# Patient Record
Sex: Male | Born: 1960 | Race: White | Hispanic: No | Marital: Married | Smoking: Never smoker
Health system: Southern US, Community
[De-identification: ages and names within clinical notes are randomized; demographics above are authoritative.]

## PROBLEM LIST (undated history)

## (undated) DIAGNOSIS — J45909 Unspecified asthma, uncomplicated: Secondary | ICD-10-CM

## (undated) DIAGNOSIS — N400 Enlarged prostate without lower urinary tract symptoms: Secondary | ICD-10-CM

## (undated) DIAGNOSIS — T7840XA Allergy, unspecified, initial encounter: Secondary | ICD-10-CM

## (undated) DIAGNOSIS — M069 Rheumatoid arthritis, unspecified: Secondary | ICD-10-CM

## (undated) HISTORY — DX: Unspecified asthma, uncomplicated: J45.909

## (undated) HISTORY — PX: NO PAST SURGERIES: SHX2092

## (undated) HISTORY — DX: Rheumatoid arthritis, unspecified: M06.9

## (undated) HISTORY — DX: Allergy, unspecified, initial encounter: T78.40XA

## (undated) HISTORY — DX: Benign prostatic hyperplasia without lower urinary tract symptoms: N40.0

---

## 2003-12-08 ENCOUNTER — Encounter: Admission: RE | Admit: 2003-12-08 | Discharge: 2003-12-08 | Payer: Self-pay | Admitting: Family Medicine

## 2003-12-12 ENCOUNTER — Encounter: Admission: RE | Admit: 2003-12-12 | Discharge: 2003-12-12 | Payer: Self-pay | Admitting: Family Medicine

## 2007-01-03 ENCOUNTER — Ambulatory Visit: Payer: Self-pay | Admitting: Family Medicine

## 2007-01-03 LAB — CONVERTED CEMR LAB
ALT: 23 units/L (ref 0–53)
Albumin: 3.9 g/dL (ref 3.5–5.2)
Basophils Absolute: 0.1 10*3/uL (ref 0.0–0.1)
Bilirubin Urine: NEGATIVE
Bilirubin, Direct: 0.2 mg/dL (ref 0.0–0.3)
Blood in Urine, dipstick: NEGATIVE
CO2: 29 meq/L (ref 19–32)
Calcium: 9.4 mg/dL (ref 8.4–10.5)
Eosinophils Absolute: 0.4 10*3/uL (ref 0.0–0.6)
Eosinophils Relative: 6 % — ABNORMAL HIGH (ref 0.0–5.0)
GFR calc non Af Amer: 86 mL/min
Hemoglobin: 16.7 g/dL (ref 13.0–17.0)
LDL Cholesterol: 77 mg/dL (ref 0–99)
Lymphocytes Relative: 26.3 % (ref 12.0–46.0)
MCHC: 34.8 g/dL (ref 30.0–36.0)
Monocytes Absolute: 0.7 10*3/uL (ref 0.2–0.7)
Monocytes Relative: 9.6 % (ref 3.0–11.0)
Neutrophils Relative %: 57.4 % (ref 43.0–77.0)
Platelets: 204 10*3/uL (ref 150–400)
Potassium: 4 meq/L (ref 3.5–5.1)
RBC: 5.48 M/uL (ref 4.22–5.81)
Sodium: 141 meq/L (ref 135–145)
Total Bilirubin: 1 mg/dL (ref 0.3–1.2)
Total CHOL/HDL Ratio: 3.7
VLDL: 23 mg/dL (ref 0–40)

## 2007-01-18 ENCOUNTER — Ambulatory Visit: Payer: Self-pay | Admitting: Family Medicine

## 2007-02-22 ENCOUNTER — Ambulatory Visit: Payer: Self-pay | Admitting: Family Medicine

## 2008-11-19 ENCOUNTER — Ambulatory Visit: Payer: Self-pay | Admitting: Family Medicine

## 2008-11-19 DIAGNOSIS — J309 Allergic rhinitis, unspecified: Secondary | ICD-10-CM

## 2009-05-20 ENCOUNTER — Ambulatory Visit: Payer: Self-pay | Admitting: Family Medicine

## 2009-05-20 LAB — CONVERTED CEMR LAB
ALT: 23 units/L (ref 0–53)
AST: 22 units/L (ref 0–37)
BUN: 12 mg/dL (ref 6–23)
Bilirubin Urine: NEGATIVE
Blood in Urine, dipstick: NEGATIVE
CO2: 30 meq/L (ref 19–32)
Chloride: 106 meq/L (ref 96–112)
Cholesterol: 130 mg/dL (ref 0–200)
Creatinine, Ser: 1.1 mg/dL (ref 0.4–1.5)
Eosinophils Absolute: 0.5 10*3/uL (ref 0.0–0.7)
GFR calc non Af Amer: 76.21 mL/min (ref >60)
Glucose, Bld: 91 mg/dL (ref 70–99)
HCT: 47.8 % (ref 39.0–52.0)
Hemoglobin: 16.5 g/dL (ref 13.0–17.0)
Ketones, urine, test strip: NEGATIVE
LDL Cholesterol: 52 mg/dL (ref 0–99)
MCV: 90.4 fL (ref 78.0–100.0)
Monocytes Absolute: 0.6 10*3/uL (ref 0.1–1.0)
Neutro Abs: 3.7 10*3/uL (ref 1.4–7.7)
Platelets: 181 10*3/uL (ref 150.0–400.0)
Potassium: 4.6 meq/L (ref 3.5–5.1)
RBC: 5.29 M/uL (ref 4.22–5.81)
Total Bilirubin: 0.9 mg/dL (ref 0.3–1.2)
Total CHOL/HDL Ratio: 3
Total Protein: 7.7 g/dL (ref 6.0–8.3)
Urobilinogen, UA: 0.2
VLDL: 25.8 mg/dL (ref 0.0–40.0)
WBC Urine, dipstick: NEGATIVE
pH: 7

## 2009-06-25 ENCOUNTER — Ambulatory Visit: Payer: Self-pay | Admitting: Family Medicine

## 2010-04-19 NOTE — Assessment & Plan Note (Signed)
Summary: cpx//ccm----PTS WIFE [facility] // RS   Vital Signs:  Patient profile:   50 year old male Height:      73 inches Weight:      213 pounds Temp:     98.2 degrees F oral BP sitting:   120 / 84  (left arm) Cuff size:   regular  Vitals Entered By: Kern Reap CMA Duncan Dull) (June 25, 2009 2:13 PM) CC: cpx Is Patient Diabetic? No Pain Assessment Patient in pain? no        CC:  cpx.  History of Present Illness: Thomas Davidson  is a 50 year oldmarried male, nonsmoker, who is employed at [employer] who comes in today for his annual physical examination  Is always been in excellent health.  He said no that health habits he takes excellent care of himself.  He takes no medication on a regular basis.  He does get routine eye care and dental care, tetanus, 2004, and seasonal flu 2010.  He does have allergic rhinitis in the spring.  He says layer.  He's taking over-the-counter Claritin D..  Advised to take the regular.  No D.  Allergies: No Known Drug Allergies  Past History:  Past medical, surgical, family and social histories (including risk factors) reviewed, and no changes noted (except as noted below).  Past Medical History: Reviewed history from 01/18/2007 and no changes required. Unremarkable  Family History: Reviewed history from 01/18/2007 and no changes required. Family History of Alcoholism/Addiction glaucoma  Social History: Reviewed history from 01/18/2007 and no changes required. Occupation:[employer] Married Never Smoked Alcohol use-no Drug use-no Regular exercise-yes  Review of Systems      See HPI  Physical Exam  General:  Well-developed,well-nourished,in no acute distress; alert,appropriate and cooperative throughout examination Head:  Normocephalic and atraumatic without obvious abnormalities. No apparent alopecia or balding. Eyes:  No corneal or conjunctival inflammation noted. EOMI. Perrla. Funduscopic exam benign, without hemorrhages, exudates or  papilledema. Vision grossly normal. Ears:  External ear exam shows no significant lesions or deformities.  Otoscopic examination reveals clear canals, tympanic membranes are intact bilaterally without bulging, retraction, inflammation or discharge. Hearing is grossly normal bilaterally. Nose:  External nasal examination shows no deformity or inflammation. Nasal mucosa are pink and moist without lesions or exudates. Mouth:  Oral mucosa and oropharynx without lesions or exudates.  Teeth in good repair. Neck:  No deformities, masses, or tenderness noted. Chest Wall:  No deformities, masses, tenderness or gynecomastia noted. Breasts:  No masses or gynecomastia noted Lungs:  Normal respiratory effort, chest expands symmetrically. Lungs are clear to auscultation, no crackles or wheezes. Heart:  Normal rate and regular rhythm. S1 and S2 normal without gallop, murmur, click, rub or other extra sounds. Abdomen:  Bowel sounds positive,abdomen soft and non-tender without masses, organomegaly or hernias noted. Rectal:  No external abnormalities noted. Normal sphincter tone. No rectal masses or tenderness. Genitalia:  Testes bilaterally descended without nodularity, tenderness or masses. No scrotal masses or lesions. No penis lesions or urethral discharge. Prostate:  Prostate gland firm and smooth, no enlargement, nodularity, tenderness, mass, asymmetry or induration. Msk:  No deformity or scoliosis noted of thoracic or lumbar spine.   Pulses:  R and L carotid,radial,femoral,dorsalis pedis and posterior tibial pulses are full and equal bilaterally Extremities:  No clubbing, cyanosis, edema, or deformity noted with normal full range of motion of all joints.   Neurologic:  No cranial nerve deficits noted. Station and gait are normal. Plantar reflexes are down-going bilaterally. DTRs are  symmetrical throughout. Sensory, motor and coordinative functions appear intact. Skin:  Intact without suspicious lesions or  rashes Cervical Nodes:  No lymphadenopathy noted Axillary Nodes:  No palpable lymphadenopathy Inguinal Nodes:  No significant adenopathy Psych:  Cognition and judgment appear intact. Alert and cooperative with normal attention span and concentration. No apparent delusions, illusions, hallucinations   Impression & Recommendations:  Problem # 1:  ALLERGIC RHINITIS (ICD-477.9) Assessment Deteriorated  His updated medication list for this problem includes:    Flonase 50 Mcg/act Susp (Fluticasone propionate) ..... Uad  Orders: Prescription Created Electronically [id number]) EKG w/ Interpretation (93000)  Problem # 2:  PHYSICAL EXAMINATION (ICD-V70.0) Assessment: Unchanged  Orders: Prescription Created Electronically [id number]) EKG w/ Interpretation (93000)  Complete Medication List: 1)  Claritin-d 12 Hour 5-120 Mg Xr12h-tab (Loratadine-pseudoephedrine) .... Once daily 2)  Flonase 50 Mcg/act Susp (Fluticasone propionate) .... Uad  Patient Instructions: 1)  Please schedule a follow-up appointment in 1 year. 2)  Take an Aspirin every day. 3)  take regular Claritin, or regular Zyrtec.  Also, one shot of steroid nasal spray up each nostril at bedtime Prescriptions: FLONASE 50 MCG/ACT SUSP (FLUTICASONE PROPIONATE) UAD  #2 x 6   Entered and Authorized by:   Roderick Pee MD   Signed by:   Roderick Pee MD on 06/25/2009   Method used:   Electronically to        [facility]  [address] [phone]* (retail)       [address]       [place], [place]  [address]       Ph: [phone] or [phone]       Fax: [phone]   RxID:   [id number]    Immunization History:  Influenza Immunization History:    Influenza:  historical (12/18/2008)

## 2010-12-14 ENCOUNTER — Emergency Department (HOSPITAL_BASED_OUTPATIENT_CLINIC_OR_DEPARTMENT_OTHER)
Admission: EM | Admit: 2010-12-14 | Discharge: 2010-12-14 | Disposition: A | Discharge status: Home / Self Care | Payer: 59 | Attending: Emergency Medicine | Admitting: Emergency Medicine

## 2010-12-14 ENCOUNTER — Encounter: Payer: Self-pay | Admitting: Family Medicine

## 2010-12-14 DIAGNOSIS — M79609 Pain in unspecified limb: Secondary | ICD-10-CM | POA: Insufficient documentation

## 2010-12-14 DIAGNOSIS — M79603 Pain in arm, unspecified: Secondary | ICD-10-CM

## 2010-12-14 MED ORDER — CYCLOBENZAPRINE HCL 10 MG PO TABS: 10.0000 mg | ORAL_TABLET | Freq: Two times a day (BID) | ORAL | Status: AC | PRN

## 2010-12-14 MED ORDER — HYDROCODONE-ACETAMINOPHEN 5-325 MG PO TABS: 1.0000 | ORAL_TABLET | ORAL | Status: AC | PRN

## 2010-12-14 MED ORDER — IBUPROFEN 800 MG PO TABS: 800.0000 mg | ORAL_TABLET | Freq: Three times a day (TID) | ORAL | Status: AC

## 2010-12-14 NOTE — ED Notes (Signed)
Pt c/o left "bicep pain" since yesterday "lunchtime". Pt sts he gave blood "last Thursday" and concerned it could be associated. Pt sts pain does not radiate, denies cp, shob. Pt sts left arm pain worse with movement.

## 2010-12-14 NOTE — ED Provider Notes (Signed)
History     CSN: [id number] Arrival date & time: 12/14/2010  8:32 AM  Chief Complaint  Patient presents with  . Arm Pain    HPI  (Consider location/radiation/quality/duration/timing/severity/associated sxs/prior treatment)  HPI Comments: Patient presents today with left upper arm pain. It started yesterday. Patient notes no specific injury or strain. The pain is in his left biceps muscle and extends up to his left deltoid. It is worsened by complete flexion of the arm or lifting his arm up to shoulder height. Patient has no numbness, tingling, weakness. There is no erythema, swelling, warmth. No fevers. No neck pain. No prior similar pain in this arm. Patient did give blood in that arm the last Thursday but symptoms do not begin to yesterday. Patient has been trying Advil at home without significant relief. He did come in today because somebody told him to be concerned about a blood clot otherwise he would not have come in to the ER today.  Patient is a 50 y.o. male presenting with arm pain. The history is provided by the patient and the spouse. No language interpreter was used.  Arm Pain This is a new problem. The current episode started yesterday. The problem occurs daily. The problem has not changed since onset.Pertinent negatives include no chest pain, no abdominal pain, no headaches and no shortness of breath. The symptoms are relieved by nothing. The treatment provided moderate relief.    History reviewed. No pertinent past medical history.  History reviewed. No pertinent past surgical history.  No family history on file.  History  Substance Use Topics  . Smoking status: Never Smoker   . Smokeless tobacco: Not on file  . Alcohol Use: No      Review of Systems  Review of Systems  Constitutional: Negative.  Negative for fever and chills.  HENT: Negative.   Eyes: Negative.  Negative for discharge and redness.  Respiratory: Negative.  Negative for cough and shortness of  breath.   Cardiovascular: Negative.  Negative for chest pain.  Gastrointestinal: Negative.  Negative for nausea, vomiting and abdominal pain.  Genitourinary: Negative.  Negative for hematuria.  Musculoskeletal: Positive for myalgias. Negative for back pain.  Skin: Negative.  Negative for color change and rash.  Neurological: Negative for syncope and headaches.  Hematological: Negative.  Negative for adenopathy.  Psychiatric/Behavioral: Negative.  Negative for confusion.  All other systems reviewed and are negative.    Allergies  Review of patient's allergies indicates no known allergies.  Home Medications  No current outpatient prescriptions on file.  Physical Exam    BP 156/94  Pulse 99  Temp(Src) 97.9 F (36.6 C) (Oral)  Resp 16  Ht 6\' 1"  (1.854 m)  Wt 205 lb (92.987 kg)  BMI 27.05 kg/m2  SpO2 100%  Physical Exam  Constitutional: He is oriented to person, place, and time. He appears well-developed and well-nourished.  HENT:  Head: Normocephalic and atraumatic.  Eyes: Conjunctivae and EOM are normal. Pupils are equal, round, and reactive to light.  Neck: Normal range of motion.  Pulmonary/Chest: Effort normal.  Neurological: He is alert and oriented to person, place, and time.       Patient has no swelling to the left arm. No erythema or warmth. No rash. Palpable radial pulse is strong. Capillary refill less than 2 seconds. He has good grip strength. No numbness on exam. He is able to flex his arm but has pain upon doing so. He is able to raise his arm to shoulder height  but again has pain at reaching that height. No neck tenderness on exam.  Skin: Skin is warm and dry.  Psychiatric: He has a normal mood and affect. His behavior is normal. Judgment and thought content normal.    ED Course  Procedures (including critical care time)  Labs Reviewed - No data to display No results found.   No diagnosis found.   MDM Patient appears to have a musculoskeletal source  of his pain given that it is worsened by specific movements. He is neurovascularly intact on exam. His symptoms are not consistent with a DVT. They're also not consistent with ACS or PE. They're not specifically consistent with a cervical radiculopathy either. Patient will be given muscle relaxants, Vicodin, NSAIDs for home use. He's been advised to followup with his primary care physician if not improving or worsening over the next few days. Patient is amenable to this plan.        Nat Christen, MD 12/14/10 [phone]

## 2011-01-02 ENCOUNTER — Encounter: Payer: Self-pay | Admitting: Family Medicine

## 2011-01-02 ENCOUNTER — Ambulatory Visit (INDEPENDENT_AMBULATORY_CARE_PROVIDER_SITE_OTHER): Payer: 59 | Admitting: Family Medicine

## 2011-01-02 VITALS — BP 110/78 | Temp 98.5°F | Wt 207.0 lb

## 2011-01-02 DIAGNOSIS — M79609 Pain in unspecified limb: Secondary | ICD-10-CM

## 2011-01-02 DIAGNOSIS — M79622 Pain in left upper arm: Secondary | ICD-10-CM

## 2011-01-02 NOTE — Patient Instructions (Signed)
Hise the left arm for 20 minutes twice daily.  Motrin 600 mg 3 times daily with food.  If over the next couple weeks.  The soreness does not go away, that I would call Dr. Theron Arista Whitfield/or Dr. Cleophas Dunker at [facility]. MOC for consult

## 2011-01-02 NOTE — Progress Notes (Signed)
  Subjective:    Patient ID: Thomas Davidson, male    DOB: [birth year], 50 y.o.   MRN: [id number]  HPI Thomas Davidson is a 50 year old, married male, nonsmoker, who comes in today for evaluation of pain in his left biceps for about 3 weeks.  He states he donated blood on September the 20th and on September the 24th began having pain in his left biceps.  A friend told him he may have a blood clot.  Advised him to go to the emergency room.  In the emergency room and was given Vicodin and a muscle relaxant and told he had tendinitis.  Then, a couple weeks ago.  He felt popping sensation in his left biceps.  He is right-handed.  No history of trauma   Review of Systems    General an orthopedic review of systems otherwise negative Objective:   Physical Exam  Well-developed well-nourished, male in no acute distress.  Examination of the left shoulder was normal.  Elbow and hand are normal.  There is palpable tenderness in the biceps, and I can appreciate a marble-sized cystic lesion.  Its soft, rubbery, and movable      Assessment & Plan:  Pain at left biceps.  Plan elevation, ice, Motrin orthopedic consult if symptoms persist

## 2011-02-13 ENCOUNTER — Telehealth: Payer: Self-pay | Admitting: *Deleted

## 2011-02-13 MED ORDER — AZITHROMYCIN 500 MG PO TABS: 500.0000 mg | ORAL_TABLET | Freq: Every day | ORAL | Status: AC

## 2011-02-13 NOTE — Telephone Encounter (Signed)
Patient was exposed to pertussis.  Positive test. z pak per dr Kirtland Bouchard

## 2012-01-24 ENCOUNTER — Other Ambulatory Visit (INDEPENDENT_AMBULATORY_CARE_PROVIDER_SITE_OTHER): Payer: 59

## 2012-01-24 DIAGNOSIS — Z0000 Encounter for general adult medical examination without abnormal findings: Secondary | ICD-10-CM

## 2012-01-24 LAB — POCT URINALYSIS DIPSTICK
Glucose, UA: NEGATIVE
Leukocytes, UA: NEGATIVE
Urobilinogen, UA: 0.2

## 2012-01-24 LAB — CBC WITH DIFFERENTIAL/PLATELET
Basophils Absolute: 0.1 10*3/uL (ref 0.0–0.1)
Basophils Relative: 0.6 % (ref 0.0–3.0)
Eosinophils Absolute: 0.3 10*3/uL (ref 0.0–0.7)
HCT: 47.5 % (ref 39.0–52.0)
Hemoglobin: 15.9 g/dL (ref 13.0–17.0)
Lymphocytes Relative: 24.9 % (ref 12.0–46.0)
MCV: 90.3 fl (ref 78.0–100.0)
Monocytes Absolute: 0.9 10*3/uL (ref 0.1–1.0)
Monocytes Relative: 10.7 % (ref 3.0–12.0)
Neutrophils Relative %: 59.7 % (ref 43.0–77.0)
RDW: 12.8 % (ref 11.5–14.6)

## 2012-01-24 LAB — LIPID PANEL
Cholesterol: 130 mg/dL (ref 0–200)
LDL Cholesterol: 61 mg/dL (ref 0–99)
Total CHOL/HDL Ratio: 3
Triglycerides: 121 mg/dL (ref 0.0–149.0)
VLDL: 24.2 mg/dL (ref 0.0–40.0)

## 2012-01-24 LAB — HEPATIC FUNCTION PANEL
AST: 14 U/L (ref 0–37)
Alkaline Phosphatase: 63 U/L (ref 39–117)
Total Protein: 7.2 g/dL (ref 6.0–8.3)

## 2012-01-24 LAB — BASIC METABOLIC PANEL
Calcium: 9 mg/dL (ref 8.4–10.5)
Chloride: 105 mEq/L (ref 96–112)
Creatinine, Ser: 1.1 mg/dL (ref 0.4–1.5)
Sodium: 140 mEq/L (ref 135–145)

## 2012-01-24 LAB — TSH: TSH: 1.09 u[IU]/mL (ref 0.35–5.50)

## 2012-02-05 ENCOUNTER — Encounter: Payer: Self-pay | Admitting: Family Medicine

## 2012-02-05 ENCOUNTER — Ambulatory Visit (INDEPENDENT_AMBULATORY_CARE_PROVIDER_SITE_OTHER): Payer: 59 | Admitting: Family Medicine

## 2012-02-05 VITALS — BP 130/90 | Temp 98.7°F | Ht 72.75 in | Wt 216.0 lb

## 2012-02-05 DIAGNOSIS — Z23 Encounter for immunization: Secondary | ICD-10-CM

## 2012-02-05 DIAGNOSIS — Z0000 Encounter for general adult medical examination without abnormal findings: Secondary | ICD-10-CM

## 2012-02-05 NOTE — Progress Notes (Signed)
  Subjective:    Patient ID: Thomas Davidson, male    DOB: 1961-02-02, 51 y.o.   MRN: [id number]  HPI And Bynum is a 51 year old married male nonsmoker who comes in today for general physical examination  He's always been in excellent health he said no chronic health problems and takes no medication on a regular basis for any chronic diseases.  He states he feels well and has no complaints. He had a recent eye exam which was normal, regular dental care, colonoscopy with a 50 no family history of colon cancer or polyps. Tetanus 2004 seasonal flu shot today   Review of Systems  Constitutional: Negative.   HENT: Negative.   Eyes: Negative.   Respiratory: Negative.   Cardiovascular: Negative.   Gastrointestinal: Negative.   Genitourinary: Negative.   Musculoskeletal: Negative.   Skin: Negative.   Neurological: Negative.   Hematological: Negative.   Psychiatric/Behavioral: Negative.        Objective:   Physical Exam  Constitutional: He is oriented to person, place, and time. He appears well-developed and well-nourished.  HENT:  Head: Normocephalic and atraumatic.  Right Ear: External ear normal.  Left Ear: External ear normal.  Nose: Nose normal.  Mouth/Throat: Oropharynx is clear and moist.  Eyes: Conjunctivae normal and EOM are normal. Pupils are equal, round, and reactive to light.  Neck: Normal range of motion. Neck supple. No JVD present. No tracheal deviation present. No thyromegaly present.  Cardiovascular: Normal rate, regular rhythm, normal heart sounds and intact distal pulses.  Exam reveals no gallop and no friction rub.   No murmur heard. Pulmonary/Chest: Effort normal and breath sounds normal. No stridor. No respiratory distress. He has no wheezes. He has no rales. He exhibits no tenderness.  Abdominal: Soft. Bowel sounds are normal. He exhibits no distension and no mass. There is no tenderness. There is no rebound and no guarding.  Genitourinary: Rectum normal, prostate  normal and penis normal. Guaiac negative stool. No penile tenderness.  Musculoskeletal: Normal range of motion. He exhibits no edema and no tenderness.  Lymphadenopathy:    He has no cervical adenopathy.  Neurological: He is alert and oriented to person, place, and time. He has normal reflexes. No cranial nerve deficit. He exhibits normal muscle tone.  Skin: Skin is warm and dry. No rash noted. No erythema. No pallor.  Psychiatric: He has a normal mood and affect. His behavior is normal. Judgment and thought content normal.          Assessment & Plan:  Healthy male

## 2012-02-05 NOTE — Patient Instructions (Signed)
Continue your good health habits  Return in one year sooner if any problem 

## 2012-10-20 ENCOUNTER — Emergency Department (HOSPITAL_BASED_OUTPATIENT_CLINIC_OR_DEPARTMENT_OTHER)
Admission: EM | Admit: 2012-10-20 | Discharge: 2012-10-20 | Disposition: A | Discharge status: Home / Self Care | Payer: 59 | Attending: Emergency Medicine | Admitting: Emergency Medicine

## 2012-10-20 ENCOUNTER — Encounter (HOSPITAL_BASED_OUTPATIENT_CLINIC_OR_DEPARTMENT_OTHER): Payer: Self-pay

## 2012-10-20 DIAGNOSIS — R000 Tachycardia, unspecified: Secondary | ICD-10-CM | POA: Insufficient documentation

## 2012-10-20 DIAGNOSIS — M545 Low back pain, unspecified: Secondary | ICD-10-CM | POA: Insufficient documentation

## 2012-10-20 DIAGNOSIS — R52 Pain, unspecified: Secondary | ICD-10-CM | POA: Insufficient documentation

## 2012-10-20 DIAGNOSIS — Z79899 Other long term (current) drug therapy: Secondary | ICD-10-CM | POA: Insufficient documentation

## 2012-10-20 MED ORDER — CYCLOBENZAPRINE HCL 10 MG PO TABS: 10.0000 mg | ORAL_TABLET | Freq: Three times a day (TID) | ORAL | Status: DC | PRN

## 2012-10-20 NOTE — ED Notes (Signed)
Pt c/o mainly back spasm started yesterday."just got over a summer cold".  HR Normal sinus tachycardia 124, denied chest pain, nausea, dizziness, headache. Pt alert, oriented, in no acute distress. Though coherent.

## 2012-10-20 NOTE — ED Notes (Signed)
HR now Sinus 95

## 2012-10-20 NOTE — ED Provider Notes (Signed)
CSN: [id number]     Arrival date & time 10/20/12  0944 History     First MD Initiated Contact with Patient 10/20/12 1120     Chief Complaint  Patient presents with  . Tachycardia  . Back Spasm    (Consider location/radiation/quality/duration/timing/severity/associated sxs/prior Treatment) HPI 52 year old male had a bad coughing spell for a few minutes week ago and since then has had some positional low back pain was mild for several days until the last couple days he has had some moderately severe bilateral low back spasms worse with position changes better with heating pad Tylenol and left of her Flexeril which is now run out of, he is no midline back pain or radiation down his legs no weakness or numbness no change in bowel or bladder function no fever no IV drug abuse no back surgeries no chest pain cough shortness of breath abdominal pain vomiting dysuria or other concerns. Prescription for some more Flexeril since it seems to work for him. History reviewed. No pertinent past medical history. History reviewed. No pertinent past surgical history. No family history on file. History  Substance Use Topics  . Smoking status: Never Smoker   . Smokeless tobacco: Not on file  . Alcohol Use: No    Review of Systems 10 Systems reviewed and are negative for acute change except as noted in the HPI. Allergies  Review of patient's allergies indicates no known allergies.  Home Medications   Current Outpatient Rx  Name  Route  Sig  Dispense  Refill  . cyclobenzaprine (FLEXERIL) 10 MG tablet   Oral   Take 1 tablet (10 mg total) by mouth 3 (three) times daily as needed for muscle spasms.   20 tablet   0    BP 127/85  Pulse 83  Temp(Src) 98.3 F (36.8 C) (Oral)  Resp 16  SpO2 96% Physical Exam  Nursing note and vitals reviewed. Constitutional:  Awake, alert, nontoxic appearance with baseline speech.  HENT:  Head: Atraumatic.  Eyes: Pupils are equal, round, and reactive to light.  Right eye exhibits no discharge. Left eye exhibits no discharge.  Neck: Neck supple.  Cardiovascular: Normal rate and regular rhythm.   No murmur heard. Pulmonary/Chest: Effort normal and breath sounds normal. No respiratory distress. He has no wheezes. He has no rales. He exhibits no tenderness.  Abdominal: Soft. Bowel sounds are normal. He exhibits no mass. There is no tenderness. There is no rebound.  Musculoskeletal: He exhibits tenderness. He exhibits no edema.       Thoracic back: He exhibits no tenderness.       Lumbar back: He exhibits no tenderness.  Bilateral lower extremities non tender without new rashes or color change, baseline ROM with intact DP pulses, CR<2 secs all digits bilaterally, sensation baseline light touch bilaterally for pt, DTR's symmetric and intact bilaterally KJ / AJ, motor symmetric bilateral 5 / 5 hip flexion, quadriceps, hamstrings, EHL, foot dorsiflexion, foot plantarflexion, gait somewhat antalgic but without apparent new ataxia. No midline back tenderness and currently paralumbar region is nontender as well the patient's back spasms have resolved at current time.  Neurological:  Mental status baseline for patient.  Upper extremity motor strength and sensation intact and symmetric bilaterally.  Skin: No rash noted.  Psychiatric: He has a normal mood and affect.    ED Course  ECG: Sinus tachycardia, rate 114, normal axis, nonspecific T wave abnormality, no comparison ECG immediately available Patient / Family / Caregiver informed of clinical course, understand  medical decision-making process, and agree with plan. Procedures (including critical care time)  Labs Reviewed - No data to display No results found. 1. Acute low back pain     MDM  I doubt any other EMC precluding discharge at this time including, but not necessarily limited to the following:SBI, cauda equina.  Hurman Horn, MD 10/20/12 [phone]

## 2013-01-28 ENCOUNTER — Other Ambulatory Visit (INDEPENDENT_AMBULATORY_CARE_PROVIDER_SITE_OTHER): Payer: 59

## 2013-01-28 DIAGNOSIS — Z0000 Encounter for general adult medical examination without abnormal findings: Secondary | ICD-10-CM

## 2013-01-28 LAB — HEPATIC FUNCTION PANEL
ALT: 18 U/L (ref 0–53)
Albumin: 4 g/dL (ref 3.5–5.2)
Bilirubin, Direct: 0.1 mg/dL (ref 0.0–0.3)
Total Bilirubin: 0.9 mg/dL (ref 0.3–1.2)
Total Protein: 7.2 g/dL (ref 6.0–8.3)

## 2013-01-28 LAB — LIPID PANEL
Cholesterol: 120 mg/dL (ref 0–200)
HDL: 40.2 mg/dL (ref >39.00)
LDL Cholesterol: 65 mg/dL (ref 0–99)
Triglycerides: 73 mg/dL (ref 0.0–149.0)

## 2013-01-28 LAB — CBC WITH DIFFERENTIAL/PLATELET
HCT: 45.9 % (ref 39.0–52.0)
Lymphocytes Relative: 25.8 % (ref 12.0–46.0)
Lymphs Abs: 1.9 10*3/uL (ref 0.7–4.0)
MCHC: 34.6 g/dL (ref 30.0–36.0)
MCV: 87.2 fl (ref 78.0–100.0)
Monocytes Absolute: 0.8 10*3/uL (ref 0.1–1.0)
Monocytes Relative: 10.3 % (ref 3.0–12.0)
Platelets: 190 10*3/uL (ref 150.0–400.0)
RBC: 5.26 Mil/uL (ref 4.22–5.81)
WBC: 7.3 10*3/uL (ref 4.5–10.5)

## 2013-01-28 LAB — POCT URINALYSIS DIPSTICK
Blood, UA: NEGATIVE
Glucose, UA: NEGATIVE
Nitrite, UA: NEGATIVE
Protein, UA: NEGATIVE
Spec Grav, UA: 1.01
Urobilinogen, UA: 0.2

## 2013-01-28 LAB — BASIC METABOLIC PANEL
CO2: 27 mEq/L (ref 19–32)
Calcium: 9.2 mg/dL (ref 8.4–10.5)
GFR: 87.82 mL/min (ref >60.00)
Potassium: 3.9 mEq/L (ref 3.5–5.1)

## 2013-01-28 LAB — PSA: PSA: 0.58 ng/mL (ref 0.10–4.00)

## 2013-02-05 ENCOUNTER — Encounter: Payer: Self-pay | Admitting: Family Medicine

## 2013-02-05 ENCOUNTER — Ambulatory Visit (INDEPENDENT_AMBULATORY_CARE_PROVIDER_SITE_OTHER): Payer: 59 | Admitting: Family Medicine

## 2013-02-05 DIAGNOSIS — Z23 Encounter for immunization: Secondary | ICD-10-CM

## 2013-02-05 DIAGNOSIS — Z0000 Encounter for general adult medical examination without abnormal findings: Secondary | ICD-10-CM

## 2013-02-05 DIAGNOSIS — J309 Allergic rhinitis, unspecified: Secondary | ICD-10-CM

## 2013-02-05 NOTE — Progress Notes (Signed)
  Subjective:    Patient ID: Thomas Davidson, male    DOB: 1960-09-05, 52 y.o.   MRN: [id number]  HPI  Iris is a 52 year old married male nonsmoker who comes in today for a general physical exam  He's always been in excellent health he has no chronic health problems. He does not smoke nor drink. He's exercises on a regular basis he 73 inches tall 212-lead pounds. He does takes Zyrtec plain each bedtime for allergic rhinitis  He gets his eyes checked every 2 years for glaucoma, regular dental care, due for colonoscopy.  He was seen in the emergency room September for severe back pain. Somehow got a cardiogram which was normal  Review of Systems  Constitutional: Negative.   HENT: Negative.   Eyes: Negative.   Respiratory: Negative.   Cardiovascular: Negative.   Gastrointestinal: Negative.   Genitourinary: Negative.   Musculoskeletal: Negative.   Skin: Negative.   Neurological: Negative.   Psychiatric/Behavioral: Negative.        Objective:   Physical Exam  Nursing note and vitals reviewed. Constitutional: He is oriented to person, place, and time. He appears well-developed and well-nourished.  HENT:  Head: Normocephalic and atraumatic.  Right Ear: External ear normal.  Left Ear: External ear normal.  Nose: Nose normal.  Mouth/Throat: Oropharynx is clear and moist.  Eyes: Conjunctivae and EOM are normal. Pupils are equal, round, and reactive to light.  Neck: Normal range of motion. Neck supple. No JVD present. No tracheal deviation present. No thyromegaly present.  Cardiovascular: Normal rate, regular rhythm, normal heart sounds and intact distal pulses.  Exam reveals no gallop and no friction rub.   No murmur heard. No carotid bruit bruits peripheral pulses 2+ and symmetrical  Pulmonary/Chest: Effort normal and breath sounds normal. No stridor. No respiratory distress. He has no wheezes. He has no rales. He exhibits no tenderness.  Abdominal: Soft. Bowel sounds are normal. He  exhibits no distension and no mass. There is no tenderness. There is no rebound and no guarding.  Genitourinary: Rectum normal, prostate normal and penis normal. Guaiac negative stool. No penile tenderness.  Musculoskeletal: Normal range of motion. He exhibits no edema and no tenderness.  Lymphadenopathy:    He has no cervical adenopathy.  Neurological: He is alert and oriented to person, place, and time. He has normal reflexes. No cranial nerve deficit. He exhibits normal muscle tone.  Skin: Skin is warm and dry. No rash noted. No erythema. No pallor.   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 Advise sunscreens SPF 50+  Psychiatric: He has a normal mood and affect. His behavior is normal. Judgment and thought content normal.          Assessment & Plan:  Healthy male  Allergic rhinitis continue Zyrtec  Return in 2 years for general physical exam

## 2013-02-05 NOTE — Progress Notes (Signed)
Pre visit review using our clinic review tool, if applicable. No additional management support is needed unless otherwise documented below in the visit note. 

## 2013-02-05 NOTE — Patient Instructions (Signed)
Continue your good health habits  Remember to wear sunscreens SPF 50+ and reapply every 2 hours when you're out in the sun and wear a baseball cap to protect her nose  Return in 2 years for general physical examination  Return in one year for a thorough skin exam  Continue the Zyrtec plain for your allergy symptoms  Take an aspirin tablet daily  Call Dr. Ewing Schlein,,,,,,,,,,, at [phone] to get set up for a screening colonoscopy

## 2013-06-06 ENCOUNTER — Encounter: Payer: Self-pay | Admitting: Family Medicine

## 2013-06-06 ENCOUNTER — Ambulatory Visit (INDEPENDENT_AMBULATORY_CARE_PROVIDER_SITE_OTHER): Payer: 59 | Admitting: Family Medicine

## 2013-06-06 VITALS — BP 132/82 | HR 96 | Temp 98.3°F | Wt 214.0 lb

## 2013-06-06 DIAGNOSIS — J019 Acute sinusitis, unspecified: Secondary | ICD-10-CM

## 2013-06-06 DIAGNOSIS — R39198 Other difficulties with micturition: Secondary | ICD-10-CM

## 2013-06-06 MED ORDER — TAMSULOSIN HCL 0.4 MG PO CAPS: 0.4000 mg | ORAL_CAPSULE | Freq: Every day | ORAL | Status: DC

## 2013-06-06 MED ORDER — AMOXICILLIN 875 MG PO TABS: 875.0000 mg | ORAL_TABLET | Freq: Two times a day (BID) | ORAL | Status: AC

## 2013-06-06 NOTE — Patient Instructions (Signed)

## 2013-06-06 NOTE — Progress Notes (Signed)
Pre visit review using our clinic review tool, if applicable. No additional management support is needed unless otherwise documented below in the visit note. 

## 2013-06-06 NOTE — Progress Notes (Signed)
   Subjective:    Patient ID: Thomas Davidson, male    DOB: [birth year], 53 y.o.   MRN: [id number]  HPI Seen for the following issues  Three-week history of facial pain maxillary and frontal bilaterally. Thick yellow mucus. No fever. Mild headaches off and on. Intermittent cough. Increase facial pressure especially with bending over. He has tried Delsym without much improvement cough. Does have history of spring allergies but usually not this early in the year. No sneezing.  Decreased urinary stream pressure for several months. Had physical back in the fall PSA 0.58. No burning with urination. No frequency. No nocturia. No history of urethral stricture. No urethral discharge.  No past medical history on file. No past surgical history on file.  reports that he has never smoked. He does not have any smokeless tobacco history on file. He reports that he does not drink alcohol or use illicit drugs. family history is not on file. No Known Allergies    Review of Systems  Constitutional: Negative for fever and chills.  HENT: Positive for congestion and sinus pressure. Negative for facial swelling and sore throat.   Respiratory: Positive for cough.   Genitourinary: Positive for dysuria and decreased urine volume. Negative for frequency and hematuria.       Objective:   Physical Exam  Constitutional: He appears well-developed and well-nourished.  HENT:  Right Ear: External ear normal.  Left Ear: External ear normal.  Mouth/Throat: Oropharynx is clear and moist.  Neck: Neck supple.  Cardiovascular: Normal rate.   Pulmonary/Chest: Effort normal and breath sounds normal. No respiratory distress. He has no wheezes. He has no rales.  Lymphadenopathy:    He has no cervical adenopathy.          Assessment & Plan:  #1 probable acute sinusitis. Given duration of symptoms start amoxicillin 875 mg twice daily for 10 days. Continue Delsym cough syrup for cough #2 decreased urinary stream. Recent  PSA normal. No infectious symptoms. Question BPH versus other such as urethral stricture. Trial of Flomax 0.4 mg one each bedtime. Followup with primary in 2 weeks if not seeing improvement. May need urology referral for further evaluation

## 2013-08-05 ENCOUNTER — Telehealth: Payer: Self-pay | Admitting: Family Medicine

## 2013-08-05 DIAGNOSIS — Z1211 Encounter for screening for malignant neoplasm of colon: Secondary | ICD-10-CM

## 2013-08-05 NOTE — Telephone Encounter (Signed)
Pt is needing referral for a GI, for a colonoscopy. Pt states Dr. Sherren Mocha informed him to get colonoscopy done.

## 2013-09-01 ENCOUNTER — Telehealth: Payer: Self-pay | Admitting: Family Medicine

## 2013-09-01 ENCOUNTER — Encounter: Payer: Self-pay | Admitting: Internal Medicine

## 2013-09-01 NOTE — Telephone Encounter (Signed)
Pt' spouse called to check the status of his GI referral.  I advised her the referral was sent to [facility] and they were suppose to call him directly.  I gave her the number to the GI office. She verbalized understanding and is going to call them.

## 2013-10-20 ENCOUNTER — Ambulatory Visit (AMBULATORY_SURGERY_CENTER): Payer: Self-pay | Admitting: *Deleted

## 2013-10-20 VITALS — Ht 73.0 in | Wt 209.0 lb

## 2013-10-20 DIAGNOSIS — Z1211 Encounter for screening for malignant neoplasm of colon: Secondary | ICD-10-CM

## 2013-10-20 MED ORDER — MOVIPREP 100 G PO SOLR: ORAL | Status: DC

## 2013-10-20 NOTE — Progress Notes (Signed)
Patient denies any allergies to eggs or soy. Patient has never had anesthesia. Patient denies any oxygen use at home and does not take any diet/weight loss medications. EMMI education assisgned to patient on colonoscopy, this was explained and instructions given to patient.

## 2013-10-30 ENCOUNTER — Encounter: Payer: Self-pay | Admitting: Internal Medicine

## 2013-11-03 ENCOUNTER — Ambulatory Visit (AMBULATORY_SURGERY_CENTER): Payer: 59 | Admitting: Internal Medicine

## 2013-11-03 ENCOUNTER — Encounter: Payer: Self-pay | Admitting: Internal Medicine

## 2013-11-03 VITALS — BP 120/79 | HR 70 | Temp 98.0°F | Resp 20 | Ht 73.0 in | Wt 209.0 lb

## 2013-11-03 DIAGNOSIS — Z1211 Encounter for screening for malignant neoplasm of colon: Secondary | ICD-10-CM

## 2013-11-03 MED ORDER — SODIUM CHLORIDE 0.9 % IV SOLN: 500.0000 mL | INTRAVENOUS | Status: DC

## 2013-11-03 NOTE — Op Note (Signed)
[facility]  [address]. [place], [address]   COLONOSCOPY PROCEDURE REPORT  PATIENT: Thomas Davidson, Thomas Davidson  MR#: [id number] BIRTHDATE: 12/22/1960 , 53  yrs. old GENDER: Male ENDOSCOPIST: Eustace Quail, MD REFERRED ML:[id number] Delora Fuel, M.D. PROCEDURE DATE:  11/03/2013 PROCEDURE:   Colonoscopy, screening First Screening Colonoscopy - Avg.  risk and is 50 yrs.  old or older Yes.  Prior Negative Screening - Now for repeat screening. N/A  History of Adenoma - Now for follow-up colonoscopy & has been > or = to 3 yrs.  N/A  Polyps Removed Today? No.  Recommend repeat exam, <10 yrs? No. ASA CLASS:   Class I INDICATIONS:average risk screening. MEDICATIONS: MAC sedation, administered by CRNA and propofol (Diprivan) 370mg  IV  DESCRIPTION OF PROCEDURE:   After the risks benefits and alternatives of the procedure were thoroughly explained, informed consent was obtained.  A digital rectal exam revealed no abnormalities of the rectum.   The [facility] [id number]  endoscope was introduced through the anus and advanced to the cecum, which was identified by both the appendix and ileocecal valve. No adverse events experienced.   The quality of the prep was excellent, using MoviPrep  The instrument was then slowly withdrawn as the colon was fully examined.    COLON FINDINGS: A normal appearing cecum, ileocecal valve, and appendiceal orifice were identified.  The ascending, hepatic flexure, transverse, splenic flexure, descending, sigmoid colon and rectum appeared unremarkable.  No polyps or cancers were seen. Retroflexed views revealed no abnormalities. The time to cecum=2 minutes 13 seconds.  Withdrawal time=10 minutes 14 seconds.  The scope was withdrawn and the procedure completed.  COMPLICATIONS: There were no complications.  ENDOSCOPIC IMPRESSION: 1. Normal colon  RECOMMENDATIONS: 1. Continue current colorectal screening recommendations for "routine risk" patients with a  repeat colonoscopy in 10 years.   eSigned:  Eustace Quail, MD 11/03/2013 11:46 AM   cc: Dorena Cookey, MD and The Patient

## 2013-11-03 NOTE — Patient Instructions (Signed)
YOU HAD AN ENDOSCOPIC PROCEDURE TODAY AT [facility]: Refer to the procedure report that was given to you for any specific questions about what was found during the examination.  If the procedure report does not answer your questions, please call your gastroenterologist to clarify.  If you requested that your care partner not be given the details of your procedure findings, then the procedure report has been included in a sealed envelope for you to review at your convenience later.  YOU SHOULD EXPECT: Some feelings of bloating in the abdomen. Passage of more gas than usual.  Walking can help get rid of the air that was put into your GI tract during the procedure and reduce the bloating. If you had a lower endoscopy (such as a colonoscopy or flexible sigmoidoscopy) you may notice spotting of blood in your stool or on the toilet paper. If you underwent a bowel prep for your procedure, then you may not have a normal bowel movement for a few days.  DIET: Your first meal following the procedure should be a light meal and then it is ok to progress to your normal diet.  A half-sandwich or bowl of soup is an example of a good first meal.  Heavy or fried foods are harder to digest and may make you feel nauseous or bloated.  Likewise meals heavy in dairy and vegetables can cause extra gas to form and this can also increase the bloating.  Drink plenty of fluids but you should avoid alcoholic beverages for 24 hours.  ACTIVITY: Your care partner should take you home directly after the procedure.  You should plan to take it easy, moving slowly for the rest of the day.  You can resume normal activity the day after the procedure however you should NOT DRIVE or use heavy machinery for 24 hours (because of the sedation medicines used during the test).    SYMPTOMS TO REPORT IMMEDIATELY: A gastroenterologist can be reached at any hour.  During normal business hours, 8:30 AM to 5:00 PM Monday through Friday,  call [phone].  After hours and on weekends, please call the GI answering service at [phone] who will take a message and have the physician on call contact you.   Following lower endoscopy (colonoscopy or flexible sigmoidoscopy):  Excessive amounts of blood in the stool  Significant tenderness or worsening of abdominal pains  Swelling of the abdomen that is new, acute  Fever of 100F or higher  FOLLOW UP:  Our staff will call the home number listed on your records the next business day following your procedure to check on you and address any questions or concerns that you may have at that time regarding the information given to you following your procedure. This is a courtesy call and so if there is no answer at the home number and we have not heard from you through the emergency physician on call, we will assume that you have returned to your regular daily activities without incident.  SIGNATURES/CONFIDENTIALITY: You and/or your care partner have signed paperwork which will be entered into your electronic medical record.  These signatures attest to the fact that that the information above on your After Visit Summary has been reviewed and is understood.  Full responsibility of the confidentiality of this discharge information lies with you and/or your care-partner.  Continue your normal medications  Next colonoscopy- 1o years

## 2013-11-03 NOTE — Progress Notes (Signed)
Report to PACU, RN, vss, BBS= Clear.  

## 2013-11-04 ENCOUNTER — Telehealth: Payer: Self-pay | Admitting: *Deleted

## 2013-11-04 NOTE — Telephone Encounter (Signed)
  Follow up Call-  Call back number 11/03/2013  Post procedure Call Back phone  # [phone]  Permission to leave phone message Yes     Patient questions:  Do you have a fever, pain , or abdominal swelling? No. Pain Score  0 *  Have you tolerated food without any problems? Yes.    Have you been able to return to your normal activities? Yes.    Do you have any questions about your discharge instructions: Diet   No. Medications  No. Follow up visit  No.  Do you have questions or concerns about your Care? No.  Actions: * If pain score is 4 or above: No action needed, pain <4.

## 2014-01-26 ENCOUNTER — Ambulatory Visit (INDEPENDENT_AMBULATORY_CARE_PROVIDER_SITE_OTHER): Payer: 59 | Admitting: Family Medicine

## 2014-01-26 ENCOUNTER — Encounter: Payer: Self-pay | Admitting: Family Medicine

## 2014-01-26 VITALS — BP 110/80 | Temp 98.5°F | Wt 205.0 lb

## 2014-01-26 DIAGNOSIS — Z23 Encounter for immunization: Secondary | ICD-10-CM

## 2014-01-26 DIAGNOSIS — J301 Allergic rhinitis due to pollen: Secondary | ICD-10-CM

## 2014-01-26 MED ORDER — PREDNISONE 20 MG PO TABS: ORAL_TABLET | ORAL | Status: DC

## 2014-01-26 NOTE — Progress Notes (Signed)
   Subjective:    Patient ID: Thomas Davidson, male    DOB: Feb 09, 1961, 53 y.o.   MRN: [id number]  HPI Thomas Davidson is 53 year old male who comes in today for evaluation of allergic rhinitis.  He has perennial allergic rhinitis and takes Zyrtec 10 mg plain daily. 4 weeks ago he began having a lot of head congestion postnasal drip and cough and it hasn't gone away.   Review of Systems    no fever etc. Objective:   Physical Exam   well-developed well-nourished male no acute distress vital signs stable he is afebrile HEENT were negative except a lot of postnasal drip neck was supple no adenopathy lungs are clear no wheezing      Assessment & Plan:  Allergic rhinitis....... Prednisone burst and taper

## 2014-01-26 NOTE — Progress Notes (Signed)
Pre visit review using our clinic review tool, if applicable. No additional management support is needed unless otherwise documented below in the visit note. 

## 2014-01-26 NOTE — Patient Instructions (Signed)
Prednisone 20 mg........... Take 2 tablets now.......... Then one for 5 days, half for 5 days, then a half a tablet Monday Wednesday Friday for a 2 week taper  While your taken the prednisone hold the Zyrtec........... When she start tapering the prednisone,,,,,,, restart the Zyrtec

## 2014-01-27 ENCOUNTER — Telehealth: Payer: Self-pay | Admitting: Family Medicine

## 2014-01-27 NOTE — Telephone Encounter (Signed)
emmi emailed °

## 2014-05-14 ENCOUNTER — Ambulatory Visit: Payer: 59 | Admitting: Podiatry

## 2014-05-15 ENCOUNTER — Encounter: Payer: Self-pay | Admitting: Podiatry

## 2014-05-15 ENCOUNTER — Ambulatory Visit (INDEPENDENT_AMBULATORY_CARE_PROVIDER_SITE_OTHER): Payer: 59 | Admitting: Podiatry

## 2014-05-15 ENCOUNTER — Ambulatory Visit (INDEPENDENT_AMBULATORY_CARE_PROVIDER_SITE_OTHER): Payer: 59

## 2014-05-15 VITALS — BP 134/86 | HR 97 | Resp 13 | Ht 73.0 in | Wt 205.0 lb

## 2014-05-15 DIAGNOSIS — M79671 Pain in right foot: Secondary | ICD-10-CM

## 2014-05-15 DIAGNOSIS — M779 Enthesopathy, unspecified: Secondary | ICD-10-CM

## 2014-05-15 MED ORDER — MELOXICAM 7.5 MG PO TABS: 7.5000 mg | ORAL_TABLET | Freq: Every day | ORAL | Status: DC

## 2014-05-15 NOTE — Progress Notes (Signed)
   Subjective:    Patient ID: Thomas Davidson, male    DOB: 29-Aug-1960, 54 y.o.   MRN: [id number]  HPI 54 year old male presents the office they with complaints of right foot pain. States that he has pain particularly in the ball of his feet overlying the joints of the third, fourth, fifth digit. He states he has pain to the area has been progressive over the last 2 and half weeks. He denies a specific injury or trauma denies any change or increase in activity time of onset of symptoms. He denies any swelling or redness overlying the area. He states he has pain after standing for prolonged periods of time. He states pain starts to feel like a cramp-like sensation at times. No other complaints at this time.   Review of Systems  All other systems reviewed and are negative.      Objective:   Physical Exam AAO 3, NAD DP/PT pulses palpable, CRT less than 3 seconds Protective sensation intact with Simms Weinstein monofilament, vibratory sensation intact, Achilles tendon reflex intact There is mild tenderness to palpation upon palpation submetatarsal 4 on the right foot. There is no area pinpoint bony tenderness or pain with vibratory sensation overlying the metatarsal or digits. There is no pain with MTPJ range of motion. There is no overlying edema, erythema, increase in warmth. No other areas of tenderness to bilateral lower extremity is. No edema, erythema, increase in warmth. MMT 5/5, ROM WNL No open lesions or pre-ulcerative lesions bilaterally No pain with calf compression, swelling, warmth, erythema      Assessment & Plan:  54 year old male right foot submetatarsal 4 pain/capsulitis -X-rays were obtained and reviewed with the patient. There is no evidence of actual stress fracture at this time. The third metatarsal does appear to be long. Discussed likely etiology of the patient's symptoms. -Dispensed offloading pads to help take pressure off the metatarsal heads. Also prescribed meloxicam.  Discussed side effects the medication directed to stop immediately should any occur and call the office. -Follow-up in 3 weeks if symptoms are not resolved or sooner if any problems are to arise. In the meantime, encouraged to call the office with any questions/concerns/change in symptoms.  -If symptoms continue will re-xray next appointment.

## 2014-05-18 ENCOUNTER — Encounter: Payer: Self-pay | Admitting: Podiatry

## 2014-06-05 ENCOUNTER — Ambulatory Visit (INDEPENDENT_AMBULATORY_CARE_PROVIDER_SITE_OTHER): Payer: 59

## 2014-06-05 ENCOUNTER — Ambulatory Visit (INDEPENDENT_AMBULATORY_CARE_PROVIDER_SITE_OTHER): Payer: 59 | Admitting: Podiatry

## 2014-06-05 ENCOUNTER — Encounter: Payer: Self-pay | Admitting: Podiatry

## 2014-06-05 VITALS — BP 125/84 | HR 105 | Resp 15

## 2014-06-05 DIAGNOSIS — M79671 Pain in right foot: Secondary | ICD-10-CM | POA: Diagnosis not present

## 2014-06-05 DIAGNOSIS — M779 Enthesopathy, unspecified: Secondary | ICD-10-CM

## 2014-06-05 NOTE — Progress Notes (Signed)
Patient ID: Thomas Davidson, male   DOB: 01-14-1961, 54 y.o.   MRN: [id number]  Subjective: Thomas Davidson presents today for follow up evaluation of right foot pain. He states that since last appointment with wearing the metatarsal pads she does not have the pain to the bottom of his foot like he did before. He does state he continues has a mild discomfort to the foot however. Denies any swelling or redness around the area. He denies any recent injury or trauma. Denies any systemic complaints such as fevers, chills, nausea, vomiting. No acute changes since last appointment, and no other complaints at this time.   Objective: AAO x3, NAD DP/PT pulses palpable bilaterally, CRT less than 3 seconds Protective sensation intact with Simms Weinstein monofilament, vibratory sensation intact, Achilles tendon reflex intact There is mild tenderness to palpation overlying the fourth interspace on the right foot. There is no areas of pinpoint bony tenderness or pain with vibratory sensation of bilateral lower extremity. There is no pain with MTPJ range of motion. There is no palpable neuroma identified. There is no healing or numbness upon compression interspace. There is no tenderness to the metatarsals plantarly. No other areas of pinpoint bony tenderness or pain with vibratory sensation bilaterally. MMT 5/5, ROM WNL. No edema, erythema, increase in warmth to bilateral lower extremities bilaterally.  No open lesions or pre-ulcerative lesions.  No pain with calf compression, swelling, warmth, erythema  Assessment: Right foot fourth interspace pain  Plan: -All treatment options discussed with the patient including all alternatives, risks, complications.  -At this time discussed various treatment options including steroid injection. Patient wishes to proceed with steroid injection after discussing risks, complications and he verbally consents. Under sterile conditions a total of 1 mL mixture of dexamethasone phosphate and  0.5% Marcaine plain was infiltrated into the symptomatic areas of the right fourth interspace without complications. Patient tolerated the injection well without complications. Post injection care was discussed the patient. -Continue metatarsal offloading pads. -Discussed shoe gear modifications and orthotics. -Follow-up prior to his upcoming vacation or sooner if any problems are to arise. In the meantime encouraged to call the office with any questions, concerns, change in symptoms.

## 2014-06-08 ENCOUNTER — Encounter: Payer: Self-pay | Admitting: Podiatry

## 2014-06-24 ENCOUNTER — Encounter: Payer: Self-pay | Admitting: Podiatry

## 2014-06-24 ENCOUNTER — Ambulatory Visit (INDEPENDENT_AMBULATORY_CARE_PROVIDER_SITE_OTHER): Payer: 59 | Admitting: Podiatry

## 2014-06-24 VITALS — BP 117/65 | HR 87 | Resp 18

## 2014-06-24 DIAGNOSIS — M79671 Pain in right foot: Secondary | ICD-10-CM

## 2014-06-24 DIAGNOSIS — M779 Enthesopathy, unspecified: Secondary | ICD-10-CM

## 2014-06-24 MED ORDER — METHYLPREDNISOLONE (PAK) 4 MG PO TABS: ORAL_TABLET | ORAL | Status: DC

## 2014-06-24 NOTE — Progress Notes (Signed)
Patient ID: Thomas Davidson, male   DOB: 1961/03/13, 54 y.o.   MRN: [id number]  Subjective: Thomas Davidson presents today for follow up evaluation of right foot pain under the ball of the 4th toe. He states that since last appointment with wearing the metatarsal pads which provides some relief but continues to have pain. He states the injection did not help. He states he has the majority of pain to the area in the mornings when first standing or at the end of the day after being on it for a period of time. Denies any swelling or redness around the area. He denies any recent injury or trauma. Denies any systemic complaints such as fevers, chills, nausea, vomiting. No acute changes since last appointment, and no other complaints at this time.   Objective: AAO x3, NAD DP/PT pulses palpable bilaterally, CRT less than 3 seconds Protective sensation intact with Simms Weinstein monofilament, vibratory sensation intact, Achilles tendon reflex intact There is mild continued tenderness to palpation overlying the fourth interspace on the right foot and along the plantar aspect of the 4th metatarsal head. There is no areas of pinpoint bony tenderness or pain with vibratory sensation of bilateral lower extremity. There is no pain with MTPJ range of motion. There is no palpable neuroma identified. There is no surrounding nerve sensations upon compression of the fourth interspace or the other interspaces. There is prominence the metatarsal heads plantarly with mild atrophy of the fat pad. There is trace edema submetarsal 4 area. No  associated erythema or increase in warmth. No other areas of edema, erythema, increase in warmth bilaterally. No other areas of pinpoint bony tenderness or pain with vibratory sensation bilaterally. MMT 5/5, ROM WNL. No open lesions or pre-ulcerative lesions.  No pain with calf compression, swelling, warmth, erythema  Assessment: Right foot fourth interspace pain; plantar 4th metatarsal head pain.    Plan: -All treatment options discussed with the patient including all alternatives, risks, complications.  -As he continues to have symptoms to the area  prescribed Medrol Dosepak. Hold off on anti-inflammatory toe after finishing this.  -Continue metatarsal offloading pads I dispensed different pathways appointment cutouts for around the fourth metatarsal head. . -Discussed shoe gear modifications and orthotics. -If symptoms continue and appointment we'll likely obtain MRI or have custom orthotics made.  -Follow-uin 2 weeks or sooner if any problems are to arise. In the meantime encouraged to call the office with any questions, concerns, change in symptoms.

## 2014-07-08 ENCOUNTER — Ambulatory Visit: Payer: 59 | Admitting: Podiatry

## 2014-07-24 ENCOUNTER — Ambulatory Visit: Payer: 59 | Admitting: Podiatry

## 2014-11-17 ENCOUNTER — Ambulatory Visit (INDEPENDENT_AMBULATORY_CARE_PROVIDER_SITE_OTHER): Payer: 59 | Admitting: Adult Health

## 2014-11-17 ENCOUNTER — Encounter: Payer: Self-pay | Admitting: Adult Health

## 2014-11-17 VITALS — BP 124/80 | Temp 98.3°F | Ht 73.0 in | Wt 208.2 lb

## 2014-11-17 DIAGNOSIS — M545 Low back pain, unspecified: Secondary | ICD-10-CM

## 2014-11-17 MED ORDER — PREDNISONE 20 MG PO TABS: 20.0000 mg | ORAL_TABLET | Freq: Every day | ORAL | Status: DC

## 2014-11-17 MED ORDER — METHOCARBAMOL 750 MG PO TABS: 750.0000 mg | ORAL_TABLET | Freq: Three times a day (TID) | ORAL | Status: DC

## 2014-11-17 MED ORDER — OXYCODONE-ACETAMINOPHEN 10-325 MG PO TABS: 1.0000 | ORAL_TABLET | Freq: Three times a day (TID) | ORAL | Status: DC | PRN

## 2014-11-17 MED ORDER — KETOROLAC TROMETHAMINE 60 MG/2ML IM SOLN
60.0000 mg | Freq: Once | INTRAMUSCULAR | Status: AC
  Administered 2014-11-17: 60 mg via INTRAMUSCULAR

## 2014-11-17 NOTE — Progress Notes (Signed)
Pre visit review using our clinic review tool, if applicable. No additional management support is needed unless otherwise documented below in the visit note. 

## 2014-11-17 NOTE — Progress Notes (Addendum)
Subjective:    Patient ID: Thomas Davidson, male    DOB: 10-08-1960, 54 y.o.   MRN: [id number]  Back Pain This is a new problem. The current episode started in the past 7 days (Sunday). The problem occurs daily. The problem is unchanged. The pain is present in the lumbar spine and sacro-iliac. The quality of the pain is described as burning (knotting/spasming). The pain does not radiate. The pain is at a severity of 10/10. The pain is severe. The symptoms are aggravated by bending, sitting, position, standing and twisting. Stiffness is present all day. He has tried analgesics, bed rest and heat for the symptoms. The treatment provided no relief.   Denies any bowel or bladder incontinence. No numbness or tingling in extremities.    Review of Systems  Respiratory: Negative.   Cardiovascular: Negative.   Musculoskeletal: Positive for myalgias and back pain. Negative for joint swelling, gait problem, neck pain and neck stiffness.  Skin: Negative.   Neurological: Negative.   All other systems reviewed and are negative.  Past Medical History  Diagnosis Date  . Asthma     as child  . Allergy     Social History   Social History  . Marital Status: Married    Spouse Name: N/A  . Number of Children: N/A  . Years of Education: N/A   Occupational History  . Not on file.   Social History Main Topics  . Smoking status: Never Smoker   . Smokeless tobacco: Current User    Types: Chew  . Alcohol Use: No  . Drug Use: No  . Sexual Activity: Not on file   Other Topics Concern  . Not on file   Social History Narrative    Past Surgical History  Procedure Laterality Date  . No past surgeries      Family History  Problem Relation Age of Onset  . Colon cancer Neg Hx     Allergies  Allergen Reactions  . Bee Venom Anaphylaxis and Hives    Current Outpatient Prescriptions on File Prior to Visit  Medication Sig Dispense Refill  . cetirizine (ZYRTEC) 10 MG tablet Take 10 mg by mouth  daily.    . tamsulosin (FLOMAX) 0.4 MG CAPS capsule Take 1 capsule (0.4 mg total) by mouth daily. 30 capsule 3   No current facility-administered medications on file prior to visit.    BP 124/80 mmHg  Temp(Src) 98.3 F (36.8 C) (Oral)  Ht 6\' 1"  (1.854 m)  Wt 208 lb 3.2 oz (94.439 kg)  BMI 27.47 kg/m2       Objective:   Physical Exam  Constitutional: He is oriented to person, place, and time. He appears well-developed and well-nourished. No distress.  Eyes: Right eye exhibits no discharge. Left eye exhibits no discharge.  Cardiovascular: Normal rate, regular rhythm, normal heart sounds and intact distal pulses.  Exam reveals no gallop and no friction rub.   No murmur heard. Pulmonary/Chest: Effort normal and breath sounds normal. No respiratory distress. He has no wheezes. He has no rales. He exhibits no tenderness.  Musculoskeletal: He exhibits no edema or tenderness.  Unable to bend over a waist  Difficulty getting out of chair to standing position quickly.  Difficulty turning on side.   When laying supine he is able to do leg raises without any pain. No pain with knee to chest.   Neurological: He is alert and oriented to person, place, and time. He has normal reflexes.  Skin: Skin  is warm and dry. No rash noted. No erythema. No pallor.  Psychiatric: He has a normal mood and affect. His behavior is normal. Thought content normal.  Nursing note and vitals reviewed.     Assessment & Plan:  1. Bilateral low back pain without sciatica - Likely MSK in nature. Exam was not worry some for impingement of nerve or spinal stenosis.  - ketorolac (TORADOL) injection 60 mg; Inject 2 mLs (60 mg total) into the muscle once. - methocarbamol (ROBAXIN-750) 750 MG tablet; Take 1 tablet (750 mg total) by mouth 3 (three) times daily.  Dispense: 30 tablet; Refill: 0 - oxyCODONE-acetaminophen (PERCOCET) 10-325 MG per tablet; Take 1 tablet by mouth every 8 (eight) hours as needed for pain.   Dispense: 15 tablet; Refill: 0 - predniSONE (DELTASONE) 20 MG tablet; Take 1 tablet (20 mg total) by mouth daily with breakfast.  Dispense: 9 tablet; Refill: 0 - 40mg  x 3 days, 20 mg x 3 days.  - Follow up if no improvement in next 2-3 days.  - Will consider imaging of lumbar spine if no improvement.

## 2014-11-17 NOTE — Patient Instructions (Addendum)
It was great meeting you today! I am sorry you are in so much pain.   Your  Injury appears to be from a spasming muscle.    Take all medication as prescribed.   Prednisone:  Day 1 40 mg Day 2 40 mg Day 3 40 mg Day 4 20 mg Day 5 20 mg Day 6 20 mg  Robaxin (Muscle Relaxer), three times a day as needed  Percocet - every 8 hours as needed for break through pain  Continue to use heat and Ibuprofen every 8 hours.   Follow up if no improvement in the next 2-3 days   Lumbosacral Strain Lumbosacral strain is a strain of any of the parts that make up your lumbosacral vertebrae. Your lumbosacral vertebrae are the bones that make up the lower third of your backbone. Your lumbosacral vertebrae are held together by muscles and tough, fibrous tissue (ligaments).  CAUSES  A sudden blow to your back can cause lumbosacral strain. Also, anything that causes an excessive stretch of the muscles in the low back can cause this strain. This is typically seen when people exert themselves strenuously, fall, lift heavy objects, bend, or crouch repeatedly. RISK FACTORS  Physically demanding work.  Participation in pushing or pulling sports or sports that require a sudden twist of the back (tennis, golf, baseball).  Weight lifting.  Excessive lower back curvature.  Forward-tilted pelvis.  Weak back or abdominal muscles or both.  Tight hamstrings. SIGNS AND SYMPTOMS  Lumbosacral strain may cause pain in the area of your injury or pain that moves (radiates) down your leg.  DIAGNOSIS Your health care provider can often diagnose lumbosacral strain through a physical exam. In some cases, you may need tests such as X-ray exams.  TREATMENT  Treatment for your lower back injury depends on many factors that your clinician will have to evaluate. However, most treatment will include the use of anti-inflammatory medicines. HOME CARE INSTRUCTIONS   Avoid hard physical activities (tennis, racquetball,  waterskiing) if you are not in proper physical condition for it. This may aggravate or create problems.  If you have a back problem, avoid sports requiring sudden body movements. Swimming and walking are generally safer activities.  Maintain good posture.  Maintain a healthy weight.  For acute conditions, you may put ice on the injured area.  Put ice in a plastic bag.  Place a towel between your skin and the bag.  Leave the ice on for 20 minutes, 2-3 times a day.  When the low back starts healing, stretching and strengthening exercises may be recommended. SEEK MEDICAL CARE IF:  Your back pain is getting worse.  You experience severe back pain not relieved with medicines. SEEK IMMEDIATE MEDICAL CARE IF:   You have numbness, tingling, weakness, or problems with the use of your arms or legs.  There is a change in bowel or bladder control.  You have increasing pain in any area of the body, including your belly (abdomen).  You notice shortness of breath, dizziness, or feel faint.  You feel sick to your stomach (nauseous), are throwing up (vomiting), or become sweaty.  You notice discoloration of your toes or legs, or your feet get very cold. MAKE SURE YOU:   Understand these instructions.  Will watch your condition.  Will get help right away if you are not doing well or get worse. Document Released: 12/14/2004 Document Revised: 03/11/2013 Document Reviewed: 10/23/2012 [facility] Patient Information 2015 [place], [place]. This information is not intended to  replace advice given to you by your health care provider. Make sure you discuss any questions you have with your health care provider.  

## 2014-11-18 ENCOUNTER — Telehealth: Payer: Self-pay | Admitting: Adult Health

## 2014-11-18 ENCOUNTER — Telehealth: Payer: Self-pay | Admitting: Family Medicine

## 2014-11-18 NOTE — Telephone Encounter (Signed)
Spoke to patient on the phone. His back pain is getting better and so is the itching.He denies any rash or hives.  Advised he can try taking a benadryl with the Percocet to help with the itching. He is to follow up tomorrow if no improvement.

## 2014-11-18 NOTE — Telephone Encounter (Signed)
Pt saw Tommi Rumps on yesterday for back issues and was giving the following med  oxyCODONE-acetaminophen (PERCOCET) 10-325 MG per tablet pt said he began to itch after taking the med and is asking if this is a allergic reaction. Pt said he was not able to sleep for scratching.

## 2014-11-18 NOTE — Telephone Encounter (Signed)
Pls advise.  

## 2014-11-19 ENCOUNTER — Telehealth: Payer: Self-pay | Admitting: Family Medicine

## 2014-11-19 NOTE — Telephone Encounter (Signed)
Wife call to say pt is still having back pain and is having issues with urination.   Pt would like call back

## 2014-11-19 NOTE — Telephone Encounter (Signed)
Called and spoke with pt and pt states he is not having the spasms. Pt states the muscle spasms have gotten better and the pain is now dull. Pt;s concern is his urine stream.   Pt did not have a stream strong stream like before.  Pt states he has been drinking plenty of water and cranberry juice. Advised pt to continue to monitor his urine stream and call the office if pt begins to experience dysuria or hematuria.  Pt verbalized understanding.

## 2015-03-09 ENCOUNTER — Other Ambulatory Visit: Payer: Self-pay

## 2015-03-16 ENCOUNTER — Encounter: Payer: Self-pay | Admitting: Family Medicine

## 2015-03-23 ENCOUNTER — Other Ambulatory Visit (INDEPENDENT_AMBULATORY_CARE_PROVIDER_SITE_OTHER): Payer: 59

## 2015-03-23 DIAGNOSIS — Z0000 Encounter for general adult medical examination without abnormal findings: Secondary | ICD-10-CM | POA: Diagnosis not present

## 2015-03-23 LAB — PSA: PSA: 0.51 ng/mL (ref 0.10–4.00)

## 2015-03-23 LAB — POCT URINALYSIS DIPSTICK
Bilirubin, UA: NEGATIVE
Glucose, UA: NEGATIVE
Ketones, UA: NEGATIVE
Leukocytes, UA: NEGATIVE
NITRITE UA: NEGATIVE
PROTEIN UA: NEGATIVE
RBC UA: NEGATIVE
SPEC GRAV UA: 1.01
UROBILINOGEN UA: 0.2
pH, UA: 7

## 2015-03-23 LAB — TSH: TSH: 2.15 u[IU]/mL (ref 0.35–4.50)

## 2015-03-23 LAB — CBC WITH DIFFERENTIAL/PLATELET
BASOS ABS: 0 10*3/uL (ref 0.0–0.1)
Basophils Relative: 0.4 % (ref 0.0–3.0)
EOS PCT: 1.3 % (ref 0.0–5.0)
Eosinophils Absolute: 0.1 10*3/uL (ref 0.0–0.7)
HEMATOCRIT: 46.5 % (ref 39.0–52.0)
HEMOGLOBIN: 15.8 g/dL (ref 13.0–17.0)
LYMPHS PCT: 27.7 % (ref 12.0–46.0)
Lymphs Abs: 3 10*3/uL (ref 0.7–4.0)
MCHC: 33.9 g/dL (ref 30.0–36.0)
MCV: 89.2 fl (ref 78.0–100.0)
MONOS PCT: 7.6 % (ref 3.0–12.0)
Monocytes Absolute: 0.8 10*3/uL (ref 0.1–1.0)
Neutro Abs: 6.9 10*3/uL (ref 1.4–7.7)
Neutrophils Relative %: 63 % (ref 43.0–77.0)
Platelets: 202 10*3/uL (ref 150.0–400.0)
RBC: 5.21 Mil/uL (ref 4.22–5.81)
RDW: 12.8 % (ref 11.5–15.5)
WBC: 11 10*3/uL — AB (ref 4.0–10.5)

## 2015-03-23 LAB — HEPATIC FUNCTION PANEL
ALT: 18 U/L (ref 0–53)
AST: 12 U/L (ref 0–37)
Albumin: 3.9 g/dL (ref 3.5–5.2)
Alkaline Phosphatase: 63 U/L (ref 39–117)
Bilirubin, Direct: 0.1 mg/dL (ref 0.0–0.3)
Total Bilirubin: 0.7 mg/dL (ref 0.2–1.2)
Total Protein: 6.6 g/dL (ref 6.0–8.3)

## 2015-03-23 LAB — BASIC METABOLIC PANEL
BUN: 12 mg/dL (ref 6–23)
CO2: 29 mEq/L (ref 19–32)
CREATININE: 0.92 mg/dL (ref 0.40–1.50)
Calcium: 9.5 mg/dL (ref 8.4–10.5)
Chloride: 100 mEq/L (ref 96–112)
GFR: 91.47 mL/min (ref >60.00)
GLUCOSE: 81 mg/dL (ref 70–99)
POTASSIUM: 3.2 meq/L — AB (ref 3.5–5.1)
Sodium: 137 mEq/L (ref 135–145)

## 2015-03-23 LAB — LIPID PANEL
CHOLESTEROL: 162 mg/dL (ref 0–200)
HDL: 60.6 mg/dL (ref >39.00)
LDL Cholesterol: 76 mg/dL (ref 0–99)
NonHDL: 101.53
Total CHOL/HDL Ratio: 3
Triglycerides: 130 mg/dL (ref 0.0–149.0)
VLDL: 26 mg/dL (ref 0.0–40.0)

## 2015-03-29 ENCOUNTER — Encounter: Payer: Self-pay | Admitting: Family Medicine

## 2015-04-01 ENCOUNTER — Ambulatory Visit (INDEPENDENT_AMBULATORY_CARE_PROVIDER_SITE_OTHER): Payer: 59 | Admitting: Family Medicine

## 2015-04-01 ENCOUNTER — Encounter: Payer: Self-pay | Admitting: Family Medicine

## 2015-04-01 VITALS — BP 110/80 | Temp 98.2°F | Ht 73.5 in | Wt 209.0 lb

## 2015-04-01 DIAGNOSIS — Z0000 Encounter for general adult medical examination without abnormal findings: Secondary | ICD-10-CM | POA: Diagnosis not present

## 2015-04-01 DIAGNOSIS — J301 Allergic rhinitis due to pollen: Secondary | ICD-10-CM

## 2015-04-01 MED ORDER — TAMSULOSIN HCL 0.4 MG PO CAPS: 0.4000 mg | ORAL_CAPSULE | Freq: Every day | ORAL | Status: DC

## 2015-04-01 NOTE — Progress Notes (Signed)
   Subjective:    Patient ID: Thomas Davidson, male    DOB: Oct 25, 1960, 55 y.o.   MRN: [id number]  HPI Thomas Davidson is a 55 year old married male nonsmoker who comes in today for general physical examination  He's always been in excellent health he said no chronic health problems. He takes over-the-counter Zyrtec 10 mg plain for allergic rhinitis and Flomax 0.4 daily at bedtime when necessary for BPH  He gets routine eye care, dental care, colonoscopy 2015 normal.  Vaccinations up-to-date   Review of Systems  Constitutional: Negative.   HENT: Negative.   Eyes: Negative.   Respiratory: Negative.   Cardiovascular: Negative.   Gastrointestinal: Negative.   Endocrine: Negative.   Genitourinary: Negative.   Musculoskeletal: Negative.   Skin: Negative.   Allergic/Immunologic: Negative.   Neurological: Negative.   Hematological: Negative.   Psychiatric/Behavioral: Negative.        Objective:   Physical Exam  Constitutional: He is oriented to person, place, and time. He appears well-developed and well-nourished.  HENT:  Head: Normocephalic and atraumatic.  Right Ear: External ear normal.  Left Ear: External ear normal.  Nose: Nose normal.  Mouth/Throat: Oropharynx is clear and moist.  Eyes: Conjunctivae and EOM are normal. Pupils are equal, round, and reactive to light.  Neck: Normal range of motion. Neck supple. No JVD present. No tracheal deviation present. No thyromegaly present.  Cardiovascular: Normal rate, regular rhythm, normal heart sounds and intact distal pulses.  Exam reveals no gallop and no friction rub.   No murmur heard. Pulmonary/Chest: Effort normal and breath sounds normal. No stridor. No respiratory distress. He has no wheezes. He has no rales. He exhibits no tenderness.  Abdominal: Soft. Bowel sounds are normal. He exhibits no distension and no mass. There is no tenderness. There is no rebound and no guarding.  Genitourinary: Rectum normal, prostate normal and penis  normal. Guaiac negative stool. No penile tenderness.  Musculoskeletal: Normal range of motion. He exhibits no edema or tenderness.  Lymphadenopathy:    He has no cervical adenopathy.  Neurological: He is alert and oriented to person, place, and time. He has normal reflexes. No cranial nerve deficit. He exhibits normal muscle tone.  Skin: Skin is warm and dry. No rash noted. No erythema. No pallor.  Psychiatric: He has a normal mood and affect. His behavior is normal. Judgment and thought content normal.  Nursing note and vitals reviewed.         Assessment & Plan:  Healthy male  Allergic rhinitis continue Zyrtec  Symptoms of urinary obstruction with fairly normal urologic exam.......... Flomax Monday Wednesday Friday

## 2015-04-01 NOTE — Progress Notes (Signed)
Pre visit review using our clinic review tool, if applicable. No additional management support is needed unless otherwise documented below in the visit note. 

## 2015-04-01 NOTE — Patient Instructions (Signed)
Flomax.................. take 1 tablet Monday Wednesday Friday  Return in one year for general physical exam sooner if any problems.

## 2015-04-05 ENCOUNTER — Ambulatory Visit (INDEPENDENT_AMBULATORY_CARE_PROVIDER_SITE_OTHER): Payer: 59

## 2015-04-05 ENCOUNTER — Ambulatory Visit (INDEPENDENT_AMBULATORY_CARE_PROVIDER_SITE_OTHER): Payer: 59 | Admitting: Podiatry

## 2015-04-05 ENCOUNTER — Encounter: Payer: Self-pay | Admitting: Podiatry

## 2015-04-05 VITALS — BP 122/87 | HR 101 | Resp 18

## 2015-04-05 DIAGNOSIS — R52 Pain, unspecified: Secondary | ICD-10-CM

## 2015-04-05 DIAGNOSIS — D3610 Benign neoplasm of peripheral nerves and autonomic nervous system, unspecified: Secondary | ICD-10-CM | POA: Diagnosis not present

## 2015-04-05 DIAGNOSIS — G5761 Lesion of plantar nerve, right lower limb: Secondary | ICD-10-CM | POA: Diagnosis not present

## 2015-04-05 NOTE — Progress Notes (Signed)
Patient ID: JAYION FAVINGER, male   DOB: [birth year], 55 y.o.   MRN: [id number]  Subjective: 55 year old male presents the office today for recurrence of pain to his right foot. He states the pain is in between the third and fourth toes he feels that there is something in between the toes when he walks. He does get numbness and tingling to his third and fourth toes. He does of the metatarsal pads to help. No recent injury or trauma. No swelling or redness. Denies any systemic complaints such as fevers, chills, nausea, vomiting. No acute changes since last appointment, and no other complaints at this time.   Objective: AAO x3, NAD DP/PT pulses palpable bilaterally, CRT less than 3 seconds Protective sensation intact with Simms Weinstein monofilament; subjectively there is numbness and tingling to the right third and fourth toes.  There is tenderness to palpation on the interspace of the right foot in the third interspace. There is no clicking sensation or palpable neuroma identified at this time. There is no area pinpoint bony tenderness or pain the vibratory sensation bilaterally. MMT 5/5, ROM WNL. No edema, erythema, increase in warmth to bilateral lower extremities.  No open lesions or pre-ulcerative lesions.  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  -Etiology of symptoms were discussed -X-rays were obtained and reviewed with the patient. No acute fracture -A steroid injection was performed the right third interspace without complications around the area of maximal tenderness with a mixture of 0.5% Marcaine plain and Kenalog 10 without complications. Post injection care was discussed.  -Continue offloading pads. -Discussed ultrasound as well as alcohol sclerosing injections however we will hold off on  that for now.  -Patient encouraged to call the office with any questions, concerns, change in symptoms.  -FU 3 weeks  Celesta Gentile, DPM

## 2015-04-26 ENCOUNTER — Encounter: Payer: Self-pay | Admitting: Podiatry

## 2015-04-26 ENCOUNTER — Telehealth: Payer: Self-pay | Admitting: *Deleted

## 2015-04-26 ENCOUNTER — Ambulatory Visit (INDEPENDENT_AMBULATORY_CARE_PROVIDER_SITE_OTHER): Payer: 59 | Admitting: Podiatry

## 2015-04-26 VITALS — BP 161/107 | HR 86 | Resp 18

## 2015-04-26 DIAGNOSIS — D3610 Benign neoplasm of peripheral nerves and autonomic nervous system, unspecified: Secondary | ICD-10-CM

## 2015-04-26 DIAGNOSIS — G5761 Lesion of plantar nerve, right lower limb: Secondary | ICD-10-CM

## 2015-04-26 NOTE — Progress Notes (Signed)
Patient ID: Thomas Davidson, male   DOB: Jul 17, 1960, 55 y.o.   MRN: [id number]  Subjective: 55 year old male presents the office today for follow-up of pain to his right foot.  He states the injection did help for couple days in the past as help with the pain has recurred. He does continue to get numbness to his third and fourth toe on his right foot. Denies any systemic complaints such as fevers, chills, nausea, vomiting. No acute changes since last appointment, and no other complaints at this time.   Objective: AAO x3, NAD DP/PT pulses palpable bilaterally, CRT less than 3 seconds Protective sensation intact with Simms Weinstein monofilament; subjectively there is numbness and tingling to the right third and fourth toes which continues.  There is tenderness to palpation on the interspace of the right foot in the third interspace. There is no palpable neuroma identified at this time. There is no area pinpoint bony tenderness or pain the vibratory sensation bilaterally. MMT 5/5, ROM WNL. No edema, erythema, increase in warmth to bilateral lower extremities.  No open lesions or pre-ulcerative lesions.  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  -Etiology of symptoms were discussed -Discussed ultrasound, alcohol sclerosing injection and steroid injection. - at this time elects to proceed with a steroid injection. A steroid injection was performed the right third interspace without complications around the area of maximal tenderness with a mixture of 0.5% Marcaine plain and Kenalog 10 without complications. Post injection care was discussed.  -Will order an ultrasound to confirm neuroma  -Continue offloading pads. -Discussed ultrasound as well as alcohol sclerosing injections however we will hold off on that  for now.  -Patient encouraged to call the office with any questions, concerns, change in symptoms.  -F/U 3 weeks  Celesta Gentile, DPM

## 2015-04-26 NOTE — Telephone Encounter (Addendum)
-----   Message from Trula Slade, DPM sent at 04/26/2015  8:42 AM EST ----- Can you please order a ultrasound of the right foot to evaluate for neuroma of the 3rd interspace.   04/27/2015-[employer] DOES NOT REQUIRE PRIOR AUTHORIZATION FOR ULTRASOUNDS, REFERENCE #[id number]. Faxed to [place].

## 2015-05-03 ENCOUNTER — Ambulatory Visit
Admission: RE | Admit: 2015-05-03 | Discharge: 2015-05-03 | Disposition: A | Discharge status: Home / Self Care | Payer: 59 | Source: Ambulatory Visit | Attending: Podiatry | Admitting: Podiatry

## 2015-05-03 DIAGNOSIS — D3610 Benign neoplasm of peripheral nerves and autonomic nervous system, unspecified: Secondary | ICD-10-CM

## 2015-05-17 ENCOUNTER — Encounter: Payer: Self-pay | Admitting: Podiatry

## 2015-05-17 ENCOUNTER — Ambulatory Visit (INDEPENDENT_AMBULATORY_CARE_PROVIDER_SITE_OTHER): Payer: 59 | Admitting: Podiatry

## 2015-05-17 VITALS — BP 145/95 | HR 87 | Resp 18

## 2015-05-17 DIAGNOSIS — D3610 Benign neoplasm of peripheral nerves and autonomic nervous system, unspecified: Secondary | ICD-10-CM

## 2015-05-17 MED ORDER — METHYLPREDNISOLONE 4 MG PO TBPK: ORAL_TABLET | ORAL | Status: DC

## 2015-05-17 NOTE — Progress Notes (Signed)
Patient ID: SINCEAR TANTON, male   DOB: 1961-02-05, 55 y.o.   MRN: [id number]  Subjective: 55 year old male presents the office today for follow-up of pain to his right foot. He states the last steroid injection only lasted for a day or so before the pain started to recur. He states he's been getting more shooting pain into the third and fourth toes as well. Also presents to discuss ultrasound results. No recent injury or trauma. No swelling or redness. No other complaints at this time.  Objective: AAO x3, NAD DP/PT pulses palpable bilaterally, CRT less than 3 seconds Protective sensation intact with Simms Weinstein monofilament; subjectively there is numbness and tingling to the right third and fourth toes which continues.  There is tenderness to palpation on the interspace of the right foot in the third interspace. There is no area pinpoint bony tenderness or pain the vibratory sensation bilaterally. MMT 5/5, ROM WNL. No edema, erythema, increase in warmth to bilateral lower extremities.  No open lesions or pre-ulcerative lesions.  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  -Ultrasound results were discussed the patient. See below. -At this point a steroid injections are helping discussed all call sclerosing injections for which she elected to proceed with today. Under sterile conditions and all sclerosing injection was performed in the third interspace on the area of maximal tenderness without, occasions. Post injection care was discussed. -Offloading pads were dispensed. -Medrol Dosepak. He'll be loaded [place] this week has helped him last time he went as he is doing quite a bit of walking. -Follow-up in 2 weeks or sooner if any issues are to arise.  Celesta Gentile, DPM

## 2015-06-04 ENCOUNTER — Ambulatory Visit (INDEPENDENT_AMBULATORY_CARE_PROVIDER_SITE_OTHER): Payer: 59 | Admitting: Podiatry

## 2015-06-04 ENCOUNTER — Encounter: Payer: Self-pay | Admitting: Podiatry

## 2015-06-04 VITALS — BP 144/95 | HR 96 | Resp 18

## 2015-06-04 DIAGNOSIS — G5761 Lesion of plantar nerve, right lower limb: Secondary | ICD-10-CM | POA: Diagnosis not present

## 2015-06-04 DIAGNOSIS — D3610 Benign neoplasm of peripheral nerves and autonomic nervous system, unspecified: Secondary | ICD-10-CM

## 2015-06-04 DIAGNOSIS — M722 Plantar fascial fibromatosis: Secondary | ICD-10-CM

## 2015-06-04 NOTE — Progress Notes (Addendum)
Patient ID: Thomas Davidson, male   DOB: [birth year], 55 y.o.   MRN: [id number]  Subjective: 55 year old male presents the office today for follow-up of pain to his right foot. Since last appointment he did go to [employer] do quite a bit of walking. It is hiking shoes he does continue to get pain although he wears a tennis shoe with his offloading pads this does help. He has noticed a small amount of improvement since last when with the alcohol injections. He also states he gets pain on the arch of the foot when he has a lot of walking. Or redness. No recent injury or trauma. Continue to get numbness and tingling to his fourth and third toe. No other complaints at this time.  Objective: AAO x3, NAD DP/PT pulses palpable bilaterally, CRT less than 3 seconds Protective sensation intact with Simms Weinstein monofilament; subjectively there is numbness and tingling to the right third and fourth toes which continues.   There is tenderness to palpation on the interspace of the right foot in the third interspace. There is no area pinpoint bony tenderness or pain the vibratory sensation bilaterally. MMT 5/5, ROM WNL. No edema, erythema, increase in warmth to bilateral lower extremities.  There is mild tenderness on the medial band of the plantar fascia within the arch the foot. The plantar fascia appears to be intact. No pain on the insertion of the calcaneus. No open lesions or pre-ulcerative lesions.  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 . -Today he underwent a second alcohol sclerosing injection into the right third interspace without complications. Post injection care was discussed. Mrs. commode under sterile conditions. -Offloading pads were dispensed. -He was scanned for orthotics  today but we will await insurance approval but worsening them off. I believe this will help for the neuroma as well as the arch pain that he is having. -Follow-up in 2 weeks or sooner if any issues are to arise.  Celesta Gentile, DPM

## 2015-06-18 ENCOUNTER — Encounter: Payer: Self-pay | Admitting: Podiatry

## 2015-06-18 ENCOUNTER — Ambulatory Visit (INDEPENDENT_AMBULATORY_CARE_PROVIDER_SITE_OTHER): Payer: 59 | Admitting: Podiatry

## 2015-06-18 VITALS — BP 137/86 | HR 108 | Resp 18

## 2015-06-18 DIAGNOSIS — D3610 Benign neoplasm of peripheral nerves and autonomic nervous system, unspecified: Secondary | ICD-10-CM

## 2015-06-18 NOTE — Progress Notes (Signed)
Patient ID: Thomas Davidson, male   DOB: [birth year], 55 y.o.   MRN: [id number]  Subjective: 55 year old male presents the office today for follow-up of pain to his right foot. Since last appointment he states that he is doing well, and his pain has improved. He has not had the stinging pain that he was having before starting the injections. Continues with metatarsal pads which help as well. No swelling or redness. No recent injury or trauma. No other complaints.   Objective: AAO x3, NAD DP/PT pulses palpable bilaterally, CRT less than 3 seconds Protective sensation intact with Simms Weinstein monofilament; subjectively there is decreased numbness and tingling to the right third and fourth toes.    There is tenderness to palpation on the interspace of the right foot in the third interspace, but improved. There is no area pinpoint bony tenderness or pain the vibratory sensation bilaterally. MMT 5/5, ROM WNL. No edema, erythema, increase in warmth to bilateral lower extremities.  There is mild tenderness on the medial band of the plantar fascia within the arch the foot. The plantar fascia appears to be intact. No pain on the insertion of the calcaneus. No open lesions or pre-ulcerative lesions.  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 . -Today he underwent a third alcohol sclerosing injection into the right third interspace without complications. Post injection care was discussed. Mrs. commode under sterile conditions. -Offloading pads were dispensed. -Follow-up in 2 weeks or sooner if any issues are to arise.  Celesta Gentile, DPM

## 2015-07-09 ENCOUNTER — Ambulatory Visit: Payer: 59 | Admitting: Podiatry

## 2015-07-23 ENCOUNTER — Encounter: Payer: 59 | Admitting: Podiatry

## 2015-07-29 NOTE — Progress Notes (Signed)
Patient ID: Thomas Davidson, male   DOB: Jul 22, 1960, 55 y.o.   MRN: [id number]  No show

## 2016-04-28 ENCOUNTER — Other Ambulatory Visit: Payer: Self-pay | Admitting: Family Medicine

## 2016-05-18 ENCOUNTER — Ambulatory Visit (INDEPENDENT_AMBULATORY_CARE_PROVIDER_SITE_OTHER): Payer: 59 | Admitting: Family Medicine

## 2016-05-18 ENCOUNTER — Encounter: Payer: Self-pay | Admitting: Family Medicine

## 2016-05-18 VITALS — BP 120/86 | HR 90 | Temp 97.9°F | Ht 73.5 in | Wt 213.3 lb

## 2016-05-18 DIAGNOSIS — H6981 Other specified disorders of Eustachian tube, right ear: Secondary | ICD-10-CM | POA: Diagnosis not present

## 2016-05-18 DIAGNOSIS — J301 Allergic rhinitis due to pollen: Secondary | ICD-10-CM | POA: Diagnosis not present

## 2016-05-18 NOTE — Patient Instructions (Addendum)
BEFORE YOU LEAVE: -follow up: New Patient visit with Dr. Maudie Mercury in 2-4 weeks  Afrin nasal spray 2x daily for 3 days. Do not use longer then 3 days.  Flonase 2 sprays each nostril daily for 1 month.  Zyrtec nightly.  Follow up sooner if worsening, persistent ear pain, fevers, sinus pain or new concerns.  I hope you fell better soon!

## 2016-05-18 NOTE — Progress Notes (Signed)
Pre visit review using our clinic review tool, if applicable. No additional management support is needed unless otherwise documented below in the visit note. 

## 2016-05-18 NOTE — Progress Notes (Signed)
   HPI:  URI: -started: 1 week ago -symptoms:nasal congestion, PND, occasional pressure in right ear -denies:fever, SOB, NVD, tooth pain, sinus pain, body aches -has tried: Zyrtec -sick contacts/travel/risks: no reported flu, strep or tick exposure -Hx of: allergies  Wife sees me. He would like to transfer to me as well given Dr. Sherren Mocha is out frequently.  ROS: See pertinent positives and negatives per HPI.  Past Medical History:  Diagnosis Date  . Allergy   . Asthma    as child    Past Surgical History:  Procedure Laterality Date  . NO PAST SURGERIES      Family History  Problem Relation Age of Onset  . Colon cancer Neg Hx     Social History   Social History  . Marital status: Married    Spouse name: N/A  . Number of children: N/A  . Years of education: N/A   Social History Main Topics  . Smoking status: Never Smoker  . Smokeless tobacco: Current User    Types: Chew  . Alcohol use No  . Drug use: No  . Sexual activity: Not Asked   Other Topics Concern  . None   Social History Narrative  . None     Current Outpatient Prescriptions:  .  cetirizine (ZYRTEC) 10 MG tablet, Take 10 mg by mouth daily., Disp: , Rfl:  .  tamsulosin (FLOMAX) 0.4 MG CAPS capsule, TAKE 1 CAPSULE (0.4 MG TOTAL) BY MOUTH DAILY., Disp: 100 capsule, Rfl: 4  EXAM:  Vitals:   05/18/16 0937  BP: 120/86  Pulse: 90  Temp: 97.9 F (36.6 C)    Body mass index is 27.76 kg/m.  GENERAL: vitals reviewed and listed above, alert, oriented, appears well hydrated and in no acute distress  HEENT: atraumatic, conjunttiva clear, no obvious abnormalities on inspection of external nose and ears, normal appearance of ear canals and TMs Except for clear effusion on the right, clear nasal congestion, mild post oropharyngeal erythema with PND, no tonsillar edema or exudate, no sinus TTP  NECK: no obvious masses on inspection  LUNGS: clear to auscultation bilaterally, no wheezes, rales or  rhonchi, good air movement  CV: HRRR, no peripheral edema  MS: moves all extremities without noticeable abnormality  PSYCH: pleasant and cooperative, no obvious depression or anxiety  ASSESSMENT AND PLAN:  Discussed the following assessment and plan:  Acute seasonal allergic rhinitis due to pollen  Dysfunction of right eustachian tube  -given HPI and exam findings today, a serious infection or illness is unlikely. We discussed potential etiologies, with VURI and/or allergic rhinitis being most likely. Opted for short course of nasal decongestant, adding intranasal steroid to allergy regimen, continuing antihistamine. We discussed treatment side effects, likely course, antibiotic misuse, transmission, and signs of developing a serious illness. -of course, we advised to return or notify a doctor immediately if symptoms worsen or persist or new concerns arise. He refuses the flu vaccine.   Patient Instructions  BEFORE YOU LEAVE: -follow up: New Patient visit with Dr. Maudie Mercury in 2-4 weeks  Afrin nasal spray 2x daily for 3 days. Do not use longer then 3 days.  Flonase 2 sprays each nostril daily for 1 month.  Zyrtec nightly.  Follow up sooner if worsening, persistent ear pain, fevers, sinus pain or new concerns.  I hope you fell better soon!   Colin Benton R., DO

## 2016-06-11 NOTE — Progress Notes (Signed)
HPI:  Thomas Davidson is here to establish care. Used to see Dr. Sherren Mocha. He would like to do his preventive care labs/visit as well today. Reports only health problems are seasonal allergies and mid BPH. Take zyrtec seasonal. Takes flomax 3 days per week rxd by prior PCP and requests refill. Symptoms stbale. Due for hep c screen, flu vaccine, HIV screen, labs. Declines HIV screen. Reports had cholesterol and diabetes labs at work 1 month ago and normal. Gets 6000 steps per day. Feels diet is pretty good. Normal colonoscopy 10/2013. Normal PSA last year.  ROS negative for unless reported above: fevers, unintentional weight loss, hearing or vision loss, chest pain, palpitations, struggling to breath, hemoptysis, melena, hematochezia, hematuria, falls, loc, si, thoughts of self harm  Past Medical History:  Diagnosis Date  . Allergy   . Asthma    as child    Past Surgical History:  Procedure Laterality Date  . NO PAST SURGERIES      Family History  Problem Relation Age of Onset  . Throat cancer Father     smoker  . Colon cancer Neg Hx     Social History   Social History  . Marital status: Married    Spouse name: N/A  . Number of children: N/A  . Years of education: N/A   Social History Main Topics  . Smoking status: Never Smoker  . Smokeless tobacco: Current User    Types: Chew  . Alcohol use No  . Drug use: No  . Sexual activity: Not Asked   Other Topics Concern  . None   Social History Narrative   Work or School: [employer] Situation: lives with wife      Spiritual Beliefs: Baptist      Lifestyle: no aerobic exercise, diet so so        Current Outpatient Prescriptions:  .  cetirizine (ZYRTEC) 10 MG tablet, Take 10 mg by mouth daily., Disp: , Rfl:  .  tamsulosin (FLOMAX) 0.4 MG CAPS capsule, Take 1 capsule (0.4 mg total) by mouth daily., Disp: 100 capsule, Rfl: 4  EXAM:  Vitals:   06/12/16 0935  BP: 122/82  Pulse: 98  Temp: 98 F  (36.7 C)    Body mass index is 28.55 kg/m.  GENERAL: vitals reviewed and listed above, alert, oriented, appears well hydrated and in no acute distress  HEENT: atraumatic, conjunttiva clear, no obvious abnormalities on inspection of external nose and ears, normal appearance of ear canals and TMs, clear nasal congestion, mild post oropharyngeal erythema with PND, no tonsillar edema or exudate, no sinus TTP  NECK: no obvious masses on inspection  LUNGS: clear to auscultation bilaterally, no wheezes, rales or rhonchi, good air movement  CV: HRRR, no peripheral edema  GU: declined  SKIN: no abnormal lesions  MS: moves all extremities without noticeable abnormality  PSYCH: Thomas and cooperative, no obvious depression or anxiety  ASSESSMENT AND PLAN:  Discussed the following assessment and plan:  Encounter for preventive health examination  History of BPH - Plan: Basic metabolic panel  Acute seasonal allergic rhinitis due to pollen  Hep c screening in baby boomer - Plan: Hepatitis C antibody -We reviewed the PMH, PSH, FH, SH, Meds and Allergies. -We addressed current concerns per orders and patient instructions.. -level A and B USPSTF recs reviewed -health maintenance reviewed and updatesd -declined flu vaccine and HIV screen -labs per orders -refilled medication -advised increased exercise and healthy  diet -follow up yearly and as needed  -Patient advised to return or notify a doctor immediately if symptoms worsen or persist or new concerns arise.  Patient Instructions  BEFORE YOU LEAVE: -follow up: yearly for physical and as needed -labs  We have ordered labs or studies at this visit. It can take up to 1-2 weeks for results and processing. IF results require follow up or explanation, we will call you with instructions. Clinically stable results will be released to your [facility]. If you have not heard from [place] or cannot find your results in [facility] in 2 weeks please  contact our office at [phone].  If you are not yet signed up for [facility], please consider signing up.   We recommend the following healthy lifestyle for LIFE: 1) Small portions.   Tip: eat off of a salad plate instead of a dinner plate.  Tip: It is ok to feel hungry after a meal   Tip: if you need more or a snack choose fruits, veggies and/or a handful of nuts or seeds.  2) Eat a healthy clean diet.  * Tip: Avoid (less then 1 serving per week): processed foods, sweets, sweetened drinks, white starches (rice, flour, bread, potatoes, pasta, etc), red meat, fast foods, butter  *Tip: CHOOSE instead   * 5-9 servings per day of fresh or frozen fruits and vegetables (but not corn, potatoes, bananas, canned or dried fruit)   *nuts and seeds, beans   *olives and olive oil   *small portions of lean meats such as fish and white chicken    *small portions of whole grains  3)Get at least 150 minutes of sweaty aerobic exercise per week.  4)Reduce stress - consider counseling, meditation and relaxation to balance other aspects of your life.   WE NOW OFFER   [facility] FAST TRACK!!!  SAME DAY Appointments for ACUTE CARE  Such as: Sprains, Injuries, cuts, abrasions, rashes, muscle pain, joint pain, back pain Colds, flu, sore throats, headache, allergies, cough, fever  Ear pain, sinus and eye infections Abdominal pain, nausea, vomiting, diarrhea, upset stomach Animal/insect bites  3 Easy Ways to Schedule: Walk-In Scheduling Call in scheduling Mychart Sign-up: [email or web address]                  Colin Benton R.

## 2016-06-12 ENCOUNTER — Ambulatory Visit (INDEPENDENT_AMBULATORY_CARE_PROVIDER_SITE_OTHER): Payer: 59 | Admitting: Family Medicine

## 2016-06-12 ENCOUNTER — Encounter: Payer: Self-pay | Admitting: Family Medicine

## 2016-06-12 VITALS — BP 122/82 | HR 98 | Temp 98.0°F | Ht 71.5 in | Wt 207.6 lb

## 2016-06-12 DIAGNOSIS — J301 Allergic rhinitis due to pollen: Secondary | ICD-10-CM

## 2016-06-12 DIAGNOSIS — Z87438 Personal history of other diseases of male genital organs: Secondary | ICD-10-CM

## 2016-06-12 DIAGNOSIS — Z7289 Other problems related to lifestyle: Secondary | ICD-10-CM | POA: Diagnosis not present

## 2016-06-12 DIAGNOSIS — Z0000 Encounter for general adult medical examination without abnormal findings: Secondary | ICD-10-CM

## 2016-06-12 LAB — BASIC METABOLIC PANEL
BUN: 8 mg/dL (ref 6–23)
CALCIUM: 9.3 mg/dL (ref 8.4–10.5)
CO2: 26 meq/L (ref 19–32)
CREATININE: 0.89 mg/dL (ref 0.40–1.50)
Chloride: 104 mEq/L (ref 96–112)
GFR: 94.6 mL/min (ref >60.00)
GLUCOSE: 98 mg/dL (ref 70–99)
Potassium: 3.8 mEq/L (ref 3.5–5.1)
SODIUM: 137 meq/L (ref 135–145)

## 2016-06-12 MED ORDER — TAMSULOSIN HCL 0.4 MG PO CAPS: 0.4000 mg | ORAL_CAPSULE | Freq: Every day | ORAL | 4 refills | Status: DC

## 2016-06-12 NOTE — Patient Instructions (Signed)
BEFORE YOU LEAVE: -follow up: yearly for physical and as needed -labs  We have ordered labs or studies at this visit. It can take up to 1-2 weeks for results and processing. IF results require follow up or explanation, we will call you with instructions. Clinically stable results will be released to your [facility]. If you have not heard from [place] or cannot find your results in [facility] in 2 weeks please contact our office at [phone].  If you are not yet signed up for [facility], please consider signing up.   We recommend the following healthy lifestyle for LIFE: 1) Small portions.   Tip: eat off of a salad plate instead of a dinner plate.  Tip: It is ok to feel hungry after a meal   Tip: if you need more or a snack choose fruits, veggies and/or a handful of nuts or seeds.  2) Eat a healthy clean diet.  * Tip: Avoid (less then 1 serving per week): processed foods, sweets, sweetened drinks, white starches (rice, flour, bread, potatoes, pasta, etc), red meat, fast foods, butter  *Tip: CHOOSE instead   * 5-9 servings per day of fresh or frozen fruits and vegetables (but not corn, potatoes, bananas, canned or dried fruit)   *nuts and seeds, beans   *olives and olive oil   *small portions of lean meats such as fish and white chicken    *small portions of whole grains  3)Get at least 150 minutes of sweaty aerobic exercise per week.  4)Reduce stress - consider counseling, meditation and relaxation to balance other aspects of your life.   WE NOW OFFER   [facility] FAST TRACK!!!  SAME DAY Appointments for ACUTE CARE  Such as: Sprains, Injuries, cuts, abrasions, rashes, muscle pain, joint pain, back pain Colds, flu, sore throats, headache, allergies, cough, fever  Ear pain, sinus and eye infections Abdominal pain, nausea, vomiting, diarrhea, upset stomach Animal/insect bites  3 Easy Ways to Schedule: Walk-In Scheduling Call in scheduling Mychart Sign-up:  [email or web address]

## 2016-06-12 NOTE — Progress Notes (Signed)
Pre visit review using our clinic review tool, if applicable. No additional management support is needed unless otherwise documented below in the visit note. 

## 2016-06-13 LAB — HEPATITIS C ANTIBODY: HCV Ab: NEGATIVE

## 2016-10-25 ENCOUNTER — Ambulatory Visit (INDEPENDENT_AMBULATORY_CARE_PROVIDER_SITE_OTHER): Payer: 59 | Admitting: Family Medicine

## 2016-10-25 VITALS — BP 110/70 | HR 78 | Temp 98.2°F | Wt 210.3 lb

## 2016-10-25 DIAGNOSIS — J320 Chronic maxillary sinusitis: Secondary | ICD-10-CM | POA: Diagnosis not present

## 2016-10-25 NOTE — Progress Notes (Signed)
Subjective:     Patient ID: Thomas Davidson, male   DOB: 05-Sep-1960, 56 y.o.   MRN: [id number]  HPI Patient seen as a work in with 2 month history of right maxillary sinus fullness and congestion. He's tried multiple things including Flonase, Zyrtec, and Afrin without improvement. He's had some mild fatigue. No headaches. No fever. Occasional facial discomfort. He does have some allergy symptoms but right nasal congestion greater than left. Denies any history of chronic or recurrent sinusitis.  Past Medical History:  Diagnosis Date  . Allergy   . Asthma    as child   Past Surgical History:  Procedure Laterality Date  . NO PAST SURGERIES      reports that he has never smoked. His smokeless tobacco use includes Chew. He reports that he does not drink alcohol or use drugs. family history includes Throat cancer in his father. Allergies  Allergen Reactions  . Bee Venom Anaphylaxis and Hives     Review of Systems  Constitutional: Negative for chills and fever.  HENT: Positive for congestion and sinus pressure.   Respiratory: Negative for cough.   Neurological: Negative for headaches.       Objective:   Physical Exam  Constitutional: He appears well-developed and well-nourished.  HENT:  Right Ear: External ear normal.  Left Ear: External ear normal.  Mouth/Throat: Oropharynx is clear and moist.   significant turbulent swelling right naris greater than left but no visible polyps. Slightly tinged mucus on the right and clear on the left  Neck: Neck supple.  Cardiovascular: Normal rate and regular rhythm.   Pulmonary/Chest: Effort normal and breath sounds normal. No respiratory distress. He has no wheezes. He has no rales.  Lymphadenopathy:    He has no cervical adenopathy.       Assessment:     Patient presents with 2 month history of persistent right maxillary sinus congestion. Rule out acute versus chronic sinusitis    Plan:     -Continue with Flonase -Start Augmentin 875 mg  twice daily for 10 days -Prednisone taper over the next week -Consider limited CT sinuses if no better in 2 weeks  Eulas Post MD [facility]

## 2016-12-07 ENCOUNTER — Encounter: Payer: Self-pay | Admitting: Family Medicine

## 2017-03-29 ENCOUNTER — Encounter: Payer: Self-pay | Admitting: Family Medicine

## 2017-06-15 ENCOUNTER — Encounter: Payer: 59 | Admitting: Family Medicine

## 2017-06-20 LAB — LIPID PANEL
CHOLESTEROL: 110 (ref 0–200)
HDL: 15 — AB (ref 35–70)
Triglycerides: 105 (ref 40–160)

## 2017-06-20 LAB — BASIC METABOLIC PANEL: Glucose: 83

## 2017-06-27 NOTE — Progress Notes (Signed)
HPI:  Using dictation device. Unfortunately this device frequently misinterprets words/phrases.  Here for CPE:  -Concerns and/or follow up today: Rarely comes in - prior pt of Dr. Sherren Mocha. PMH seasonal allergies on zyrtec and BPH taking flomax 3 days per week from prior pcp.  Brought labs from health screen. See scanned document. -Diet: variety of foods, balance and well rounded, larger portion sizes - not many good fats -Exercise: > 10000 steps daily -Diabetes and Dyslipidemia Screening: -Hx of HTN: no -Vaccines: UTD -sexual activity: yes, male partner, no new partners -wants STI testing, Hep C screening (if born 01-1962): no -FH colon or prstate ca: see FH Last colon cancer screening: colonosocpy 10/2013  Last prostate ca screening:psa 2017 -Alcohol, Tobacco, drug use: see social history  Review of Systems - no fevers, unintentional weight loss, vision loss, hearing loss, chest pain, sob, hemoptysis, melena, hematochezia, hematuria, genital discharge, changing or concerning skin lesions, bleeding, bruising, loc, thoughts of self harm or SI  Past Medical History:  Diagnosis Date  . Allergy   . Asthma    as child    Past Surgical History:  Procedure Laterality Date  . NO PAST SURGERIES      Family History  Problem Relation Age of Onset  . Throat cancer Father        smoker  . Colon cancer Neg Hx     Social History   Socioeconomic History  . Marital status: Married    Spouse name: Not on file  . Number of children: Not on file  . Years of education: Not on file  . Highest education level: Not on file  Occupational History  . Not on file  Social Needs  . Financial resource strain: Not on file  . Food insecurity:    Worry: Not on file    Inability: Not on file  . Transportation needs:    Medical: Not on file    Non-medical: Not on file  Tobacco Use  . Smoking status: Never Smoker  . Smokeless tobacco: Current User    Types: Chew  Substance and Sexual  Activity  . Alcohol use: No  . Drug use: No  . Sexual activity: Not on file  Lifestyle  . Physical activity:    Days per week: Not on file    Minutes per session: Not on file  . Stress: Not on file  Relationships  . Social connections:    Talks on phone: Not on file    Gets together: Not on file    Attends religious service: Not on file    Active member of club or organization: Not on file    Attends meetings of clubs or organizations: Not on file    Relationship status: Not on file  Other Topics Concern  . Not on file  Social History Narrative   Work or School: [employer] Situation: lives with wife      Spiritual Beliefs: Baptist      Lifestyle: no aerobic exercise, diet so so     Current Outpatient Medications:  .  cetirizine (ZYRTEC) 10 MG tablet, Take 10 mg by mouth daily., Disp: , Rfl:  .  tamsulosin (FLOMAX) 0.4 MG CAPS capsule, Take 1 capsule (0.4 mg total) by mouth daily., Disp: 100 capsule, Rfl: 4  EXAM:  Vitals:   06/28/17 0926  BP: 110/78  Pulse: 86  Temp: 98 F (36.7 C)  TempSrc: Oral  Weight: 205 lb 3.2 oz (  93.1 kg)  Height: '6\' 1"'  (1.854 m)    Estimated body mass index is 27.07 kg/m as calculated from the following:   Height as of this encounter: '6\' 1"'  (1.854 m).   Weight as of this encounter: 205 lb 3.2 oz (93.1 kg).  GENERAL: vitals reviewed and listed below, alert, oriented, appears well hydrated and in no acute distress  HEENT: head atraumatic, PERRLA, normal appearance of eyes, ears, nose and mouth. moist mucus membranes.  NECK: supple, no masses or lymphadenopathy  LUNGS: clear to auscultation bilaterally, no rales, rhonchi or wheeze  CV: HRRR, no peripheral edema or cyanosis, normal pedal pulses  ABDOMEN: bowel sounds normal, soft, non tender to palpation, no masses, no rebound or guarding  GU: declined  RECTAL: refused  SKIN: few AKs on ear R, ? AK vs seb ker R dorasl hand, declined full skin exam  MS:  normal gait, moves all extremities normally  NEURO: normal gait, speech and thought processing grossly intact, muscle tone grossly intact throughout  PSYCH: normal affect, pleasant and cooperative  ASSESSMENT AND PLAN:  Discussed the following assessment and plan:  PREVENTIVE EXAM: -Discussed and advised all [place] preventive services health task force level A and B recommendations for age, sex and risks. -Advised at least 150 minutes of exercise per week and a healthy diet  -discuss prostate cancer screening, declined today -labs, studies and vaccines per orders this encounter -advised adding good fats to diet to improve hdl  Screening for depression -negative  AK (actinic keratosis) -advised treatment, he opted to return for LN2  Patient Instructions  BEFORE YOU LEAVE: -Wendie Simmer, copy of labs to scan and return to him -follow up: follow up in 2-4 weeks for AK   Preventive Care 40-64 Years, Male Preventive care refers to lifestyle choices and visits with your health care provider that can promote health and wellness. What does preventive care include?  A yearly physical exam. This is also called an annual well check.  Dental exams once or twice a year.  Routine eye exams. Ask your health care provider how often you should have your eyes checked.  Personal lifestyle choices, including: ? Daily care of your teeth and gums. ? Regular physical activity. ? Eating a healthy diet. ? Avoiding tobacco and drug use. ? Limiting alcohol use. ? Practicing safe sex. ? Taking low-dose aspirin every day starting at age 94. What happens during an annual well check? The services and screenings done by your health care provider during your annual well check will depend on your age, overall health, lifestyle risk factors, and family history of disease. Counseling Your health care provider may ask you questions about your:  Alcohol use.  Tobacco use.  Drug use.  Emotional  well-being.  Home and relationship well-being.  Sexual activity.  Eating habits.  Work and work Statistician.  Screening You may have the following tests or measurements:  Height, weight, and BMI.  Blood pressure.  Lipid and cholesterol levels. These may be checked every 5 years, or more frequently if you are over 48 years old.  Skin check.  Lung cancer screening. You may have this screening every year starting at age 62 if you have a 30-pack-year history of smoking and currently smoke or have quit within the past 15 years.  Fecal occult blood test (FOBT) of the stool. You may have this test every year starting at age 72.  Flexible sigmoidoscopy or colonoscopy. You may have a sigmoidoscopy every 5 years or a  colonoscopy every 10 years starting at age 62.  Prostate cancer screening. Recommendations will vary depending on your family history and other risks.  Hepatitis C blood test.  Hepatitis B blood test.  Sexually transmitted disease (STD) testing.  Diabetes screening. This is done by checking your blood sugar (glucose) after you have not eaten for a while (fasting). You may have this done every 1-3 years.  Discuss your test results, treatment options, and if necessary, the need for more tests with your health care provider. Vaccines Your health care provider may recommend certain vaccines, such as:  Influenza vaccine. This is recommended every year.  Tetanus, diphtheria, and acellular pertussis (Tdap, Td) vaccine. You may need a Td booster every 10 years.  Varicella vaccine. You may need this if you have not been vaccinated.  Zoster vaccine. You may need this after age 42.  Measles, mumps, and rubella (MMR) vaccine. You may need at least one dose of MMR if you were born in 1957 or later. You may also need a second dose.  Pneumococcal 13-valent conjugate (PCV13) vaccine. You may need this if you have certain conditions and have not been vaccinated.  Pneumococcal  polysaccharide (PPSV23) vaccine. You may need one or two doses if you smoke cigarettes or if you have certain conditions.  Meningococcal vaccine. You may need this if you have certain conditions.  Hepatitis A vaccine. You may need this if you have certain conditions or if you travel or work in places where you may be exposed to hepatitis A.  Hepatitis B vaccine. You may need this if you have certain conditions or if you travel or work in places where you may be exposed to hepatitis B.  Haemophilus influenzae type b (Hib) vaccine. You may need this if you have certain risk factors.  Talk to your health care provider about which screenings and vaccines you need and how often you need them. This information is not intended to replace advice given to you by your health care provider. Make sure you discuss any questions you have with your health care provider. Document Released: 04/02/2015 Document Revised: 11/24/2015 Document Reviewed: 01/05/2015 Elsevier Interactive Patient Education  2018 [employer].     No follow-ups on file.   Lucretia Kern, DO

## 2017-06-28 ENCOUNTER — Encounter: Payer: Self-pay | Admitting: Family Medicine

## 2017-06-28 ENCOUNTER — Ambulatory Visit (INDEPENDENT_AMBULATORY_CARE_PROVIDER_SITE_OTHER): Payer: 59 | Admitting: Family Medicine

## 2017-06-28 VITALS — BP 110/78 | HR 86 | Temp 98.0°F | Ht 73.0 in | Wt 205.2 lb

## 2017-06-28 DIAGNOSIS — L570 Actinic keratosis: Secondary | ICD-10-CM

## 2017-06-28 DIAGNOSIS — Z1331 Encounter for screening for depression: Secondary | ICD-10-CM | POA: Diagnosis not present

## 2017-06-28 DIAGNOSIS — Z0001 Encounter for general adult medical examination with abnormal findings: Secondary | ICD-10-CM

## 2017-06-28 DIAGNOSIS — Z0000 Encounter for general adult medical examination without abnormal findings: Secondary | ICD-10-CM

## 2017-06-28 NOTE — Patient Instructions (Signed)
BEFORE YOU LEAVE: -Wendie Simmer, copy of labs to scan and return to him -follow up: follow up in 2-4 weeks for AK   Preventive Care 40-64 Years, Male Preventive care refers to lifestyle choices and visits with your health care provider that can promote health and wellness. What does preventive care include?  A yearly physical exam. This is also called an annual well check.  Dental exams once or twice a year.  Routine eye exams. Ask your health care provider how often you should have your eyes checked.  Personal lifestyle choices, including: ? Daily care of your teeth and gums. ? Regular physical activity. ? Eating a healthy diet. ? Avoiding tobacco and drug use. ? Limiting alcohol use. ? Practicing safe sex. ? Taking low-dose aspirin every day starting at age 23. What happens during an annual well check? The services and screenings done by your health care provider during your annual well check will depend on your age, overall health, lifestyle risk factors, and family history of disease. Counseling Your health care provider may ask you questions about your:  Alcohol use.  Tobacco use.  Drug use.  Emotional well-being.  Home and relationship well-being.  Sexual activity.  Eating habits.  Work and work Statistician.  Screening You may have the following tests or measurements:  Height, weight, and BMI.  Blood pressure.  Lipid and cholesterol levels. These may be checked every 5 years, or more frequently if you are over 75 years old.  Skin check.  Lung cancer screening. You may have this screening every year starting at age 31 if you have a 30-pack-year history of smoking and currently smoke or have quit within the past 15 years.  Fecal occult blood test (FOBT) of the stool. You may have this test every year starting at age 60.  Flexible sigmoidoscopy or colonoscopy. You may have a sigmoidoscopy every 5 years or a colonoscopy every 10 years starting at age  67.  Prostate cancer screening. Recommendations will vary depending on your family history and other risks.  Hepatitis C blood test.  Hepatitis B blood test.  Sexually transmitted disease (STD) testing.  Diabetes screening. This is done by checking your blood sugar (glucose) after you have not eaten for a while (fasting). You may have this done every 1-3 years.  Discuss your test results, treatment options, and if necessary, the need for more tests with your health care provider. Vaccines Your health care provider may recommend certain vaccines, such as:  Influenza vaccine. This is recommended every year.  Tetanus, diphtheria, and acellular pertussis (Tdap, Td) vaccine. You may need a Td booster every 10 years.  Varicella vaccine. You may need this if you have not been vaccinated.  Zoster vaccine. You may need this after age 12.  Measles, mumps, and rubella (MMR) vaccine. You may need at least one dose of MMR if you were born in 1957 or later. You may also need a second dose.  Pneumococcal 13-valent conjugate (PCV13) vaccine. You may need this if you have certain conditions and have not been vaccinated.  Pneumococcal polysaccharide (PPSV23) vaccine. You may need one or two doses if you smoke cigarettes or if you have certain conditions.  Meningococcal vaccine. You may need this if you have certain conditions.  Hepatitis A vaccine. You may need this if you have certain conditions or if you travel or work in places where you may be exposed to hepatitis A.  Hepatitis B vaccine. You may need this if you have  certain conditions or if you travel or work in places where you may be exposed to hepatitis B.  Haemophilus influenzae type b (Hib) vaccine. You may need this if you have certain risk factors.  Talk to your health care provider about which screenings and vaccines you need and how often you need them. This information is not intended to replace advice given to you by your health  care provider. Make sure you discuss any questions you have with your health care provider. Document Released: 04/02/2015 Document Revised: 11/24/2015 Document Reviewed: 01/05/2015 Elsevier Interactive Patient Education  [employer].

## 2017-06-30 ENCOUNTER — Other Ambulatory Visit: Payer: Self-pay | Admitting: Family Medicine

## 2017-07-26 ENCOUNTER — Ambulatory Visit: Payer: 59 | Admitting: Family Medicine

## 2017-12-07 IMAGING — US US EXTREM LOW*R* LIMITED
1 series · 13 of 13 positions shown · non-contrast
Comparison: Radiographs dated 04/05/2015

CLINICAL DATA: Morton's neuroma.

EXAM:
ULTRASOUND RIGHT LOWER EXTREMITY LIMITED
TECHNIQUE: Ultrasound examination of the lower extremity soft tissues was
performed in the area of clinical concern.

[Series 1: us extrem low*right* limited · 0.05mm/px · 13 of 13 slices shown]
[im 1/13]
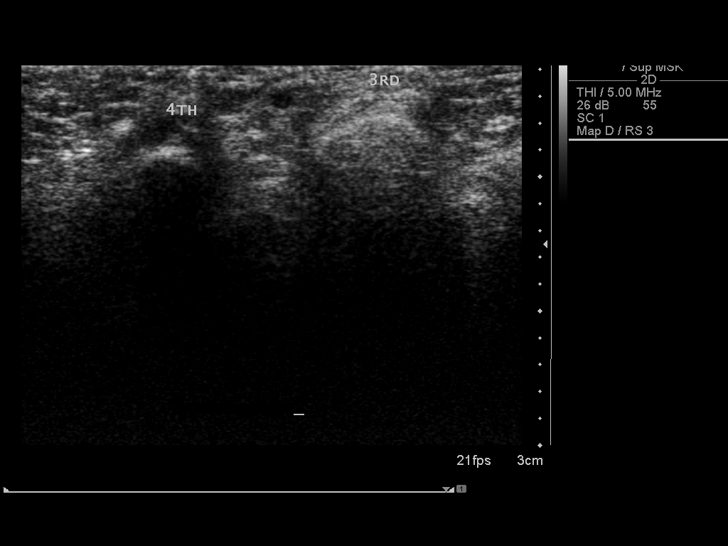
[im 2/13]
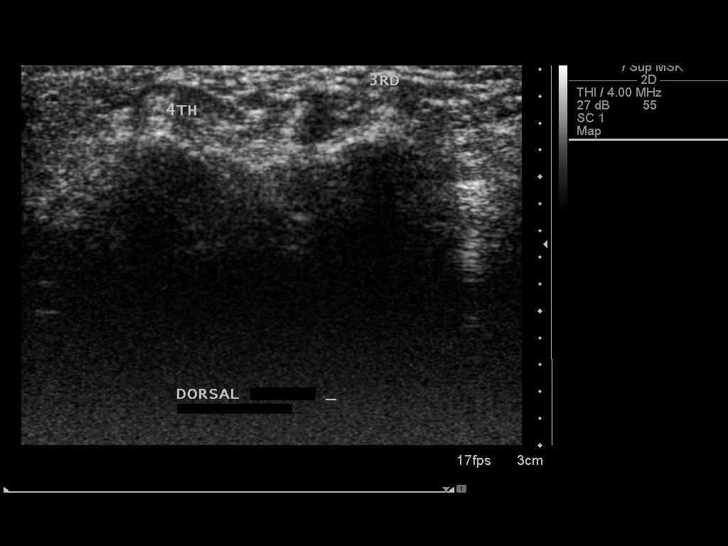
[im 3/13]
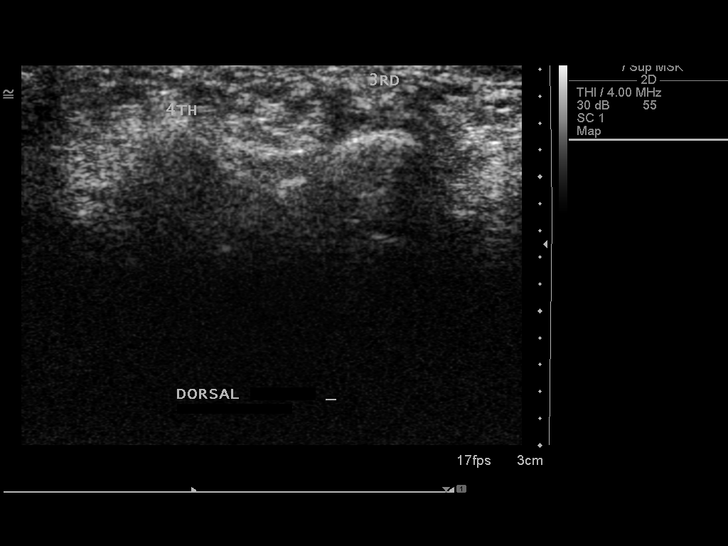
[im 4/13]
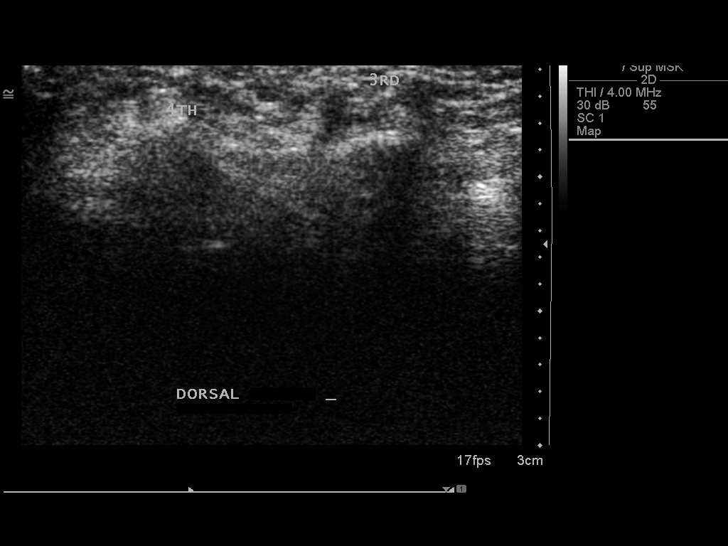
[im 5/13]
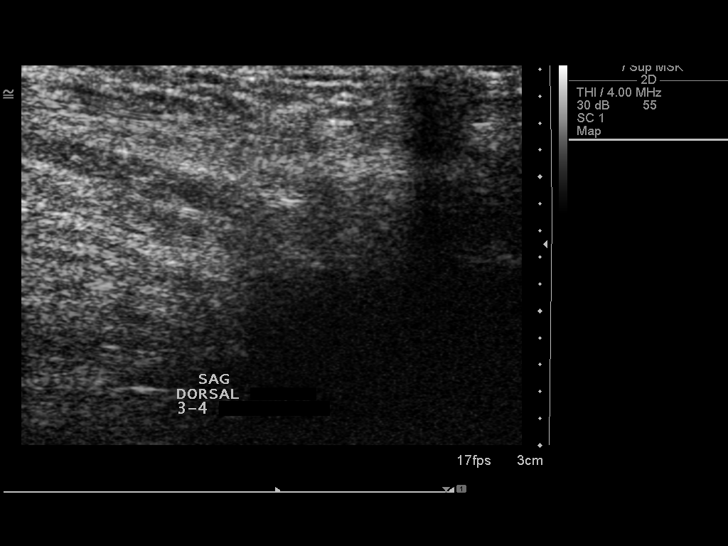
[im 6/13]
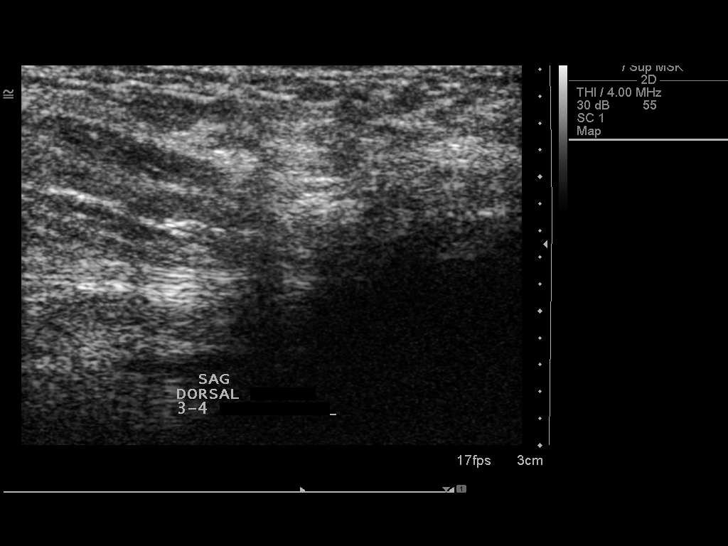
[im 7/13]
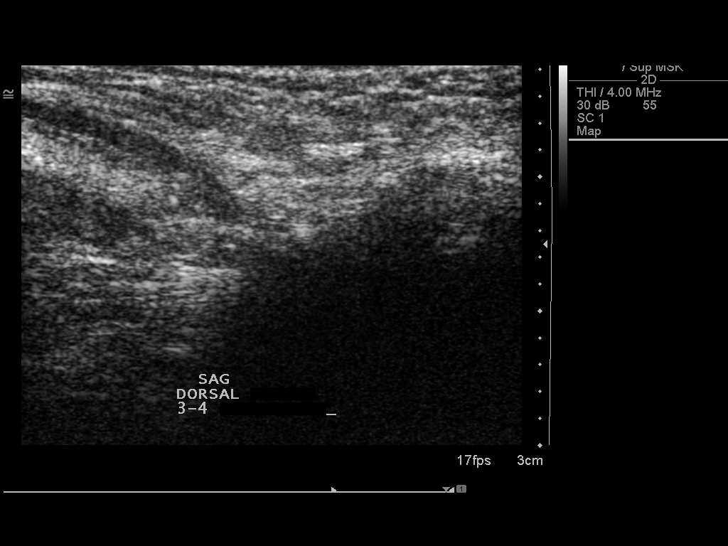
[im 8/13]
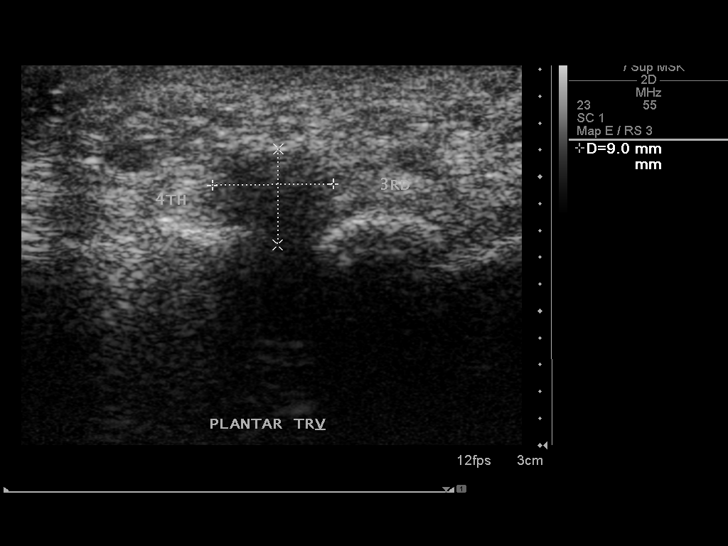
[im 9/13]
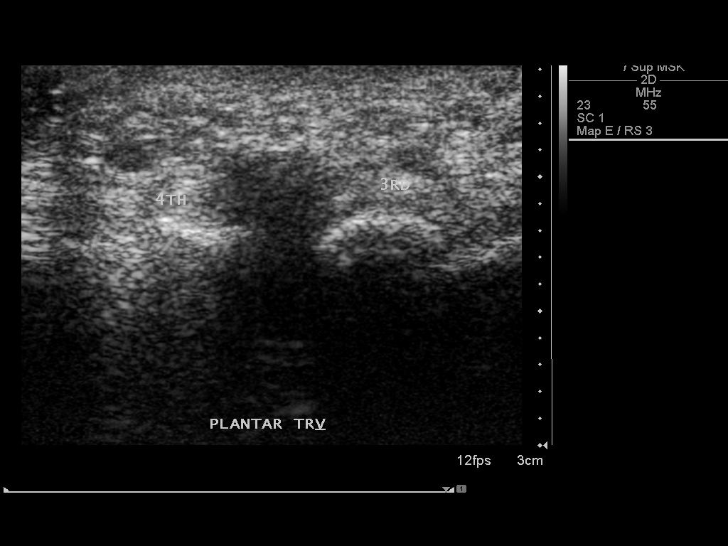
[im 10/13]
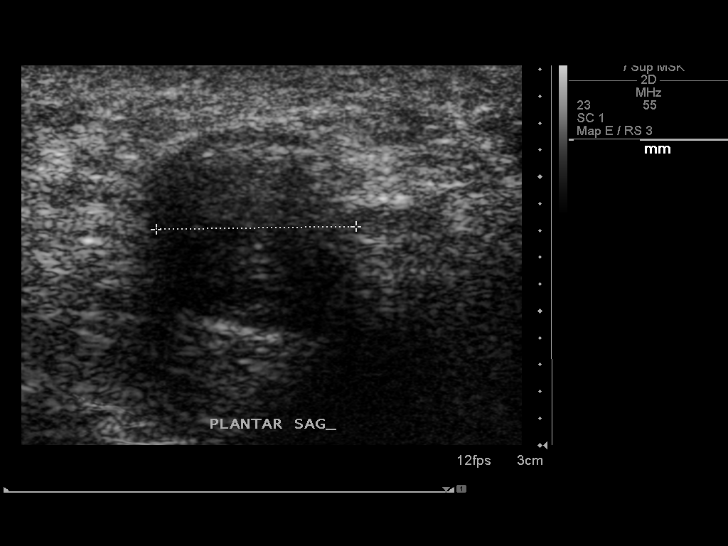
[im 11/13]
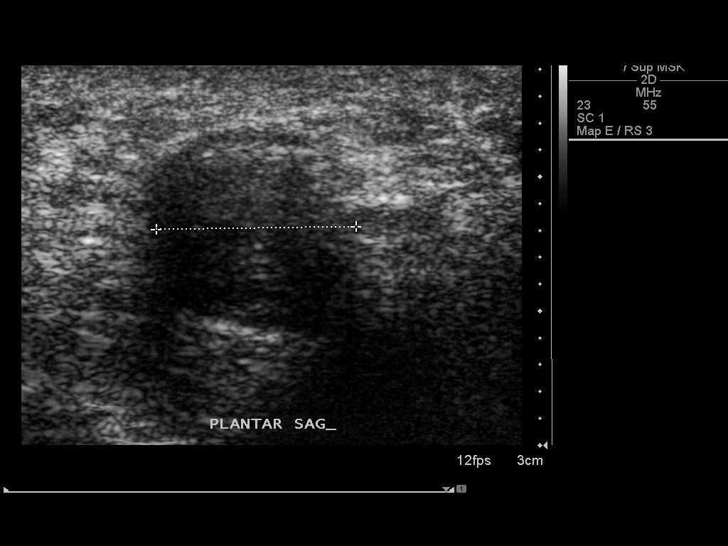
[im 12/13]
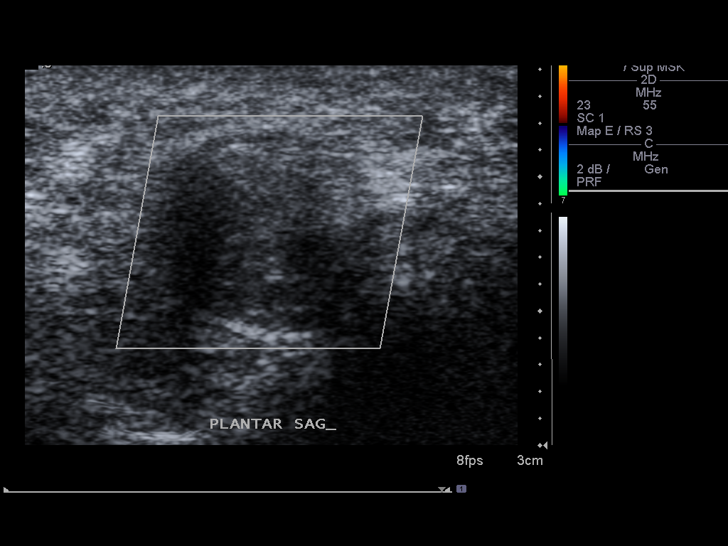
[im 13/13]
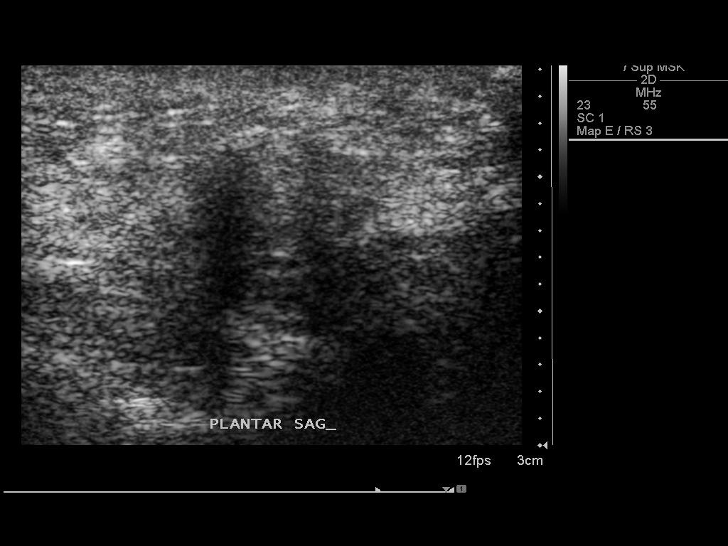

[13 of 13 positions shown; findings below may reference images not displayed]

FINDINGS: There is a prominent soft tissue mass measuring 1.5 x 0.9 x 0.7 cm.
The mass is located at the plantar aspect of the interspace between
the third and fourth toes. This is consistent with a Morton's
neuroma. No appreciable perfusion.
IMPRESSION: Prominent Morton's neuroma in the third interspace between the third
and fourth metatarsal heads.

## 2018-05-22 ENCOUNTER — Other Ambulatory Visit: Payer: Self-pay

## 2018-05-22 ENCOUNTER — Encounter: Payer: Self-pay | Admitting: Family Medicine

## 2018-05-22 ENCOUNTER — Ambulatory Visit: Payer: 59 | Admitting: Family Medicine

## 2018-05-22 VITALS — BP 122/70 | HR 107 | Temp 98.6°F | Ht 73.0 in | Wt 205.0 lb

## 2018-05-22 DIAGNOSIS — B349 Viral infection, unspecified: Secondary | ICD-10-CM | POA: Diagnosis not present

## 2018-05-22 MED ORDER — OSELTAMIVIR PHOSPHATE 75 MG PO CAPS: 75.0000 mg | ORAL_CAPSULE | Freq: Two times a day (BID) | ORAL | 0 refills | Status: DC

## 2018-05-22 NOTE — Patient Instructions (Signed)

## 2018-05-22 NOTE — Progress Notes (Signed)
  Subjective:     Patient ID: Thomas Davidson, male   DOB: [birth year], 58 y.o.   MRN: [id number]  HPI Patient is seen with upper respiratory symptoms.  He states his wife was diagnosed with flu late last week.  Monday he woke up with body aches and Monday night temperature 102.1.  He had some fever again last night.  Is had some decreased appetite.  No nausea or vomiting.  Mild cough.  Nasal congestion.  Mild sore throat.  Past Medical History:  Diagnosis Date  . Allergy   . Asthma    as child   Past Surgical History:  Procedure Laterality Date  . NO PAST SURGERIES      reports that he has never smoked. His smokeless tobacco use includes chew. He reports that he does not drink alcohol or use drugs. family history includes Throat cancer in his father. Allergies  Allergen Reactions  . Bee Venom Anaphylaxis and Hives     Review of Systems  Constitutional: Positive for chills, fatigue and fever.  HENT: Positive for congestion.   Respiratory: Positive for cough. Negative for shortness of breath.   Cardiovascular: Negative for chest pain.  Gastrointestinal: Negative for nausea and vomiting.  Genitourinary: Negative for dysuria.  Neurological: Negative for light-headedness.       Objective:   Physical Exam Constitutional:      Appearance: Normal appearance.  HENT:     Right Ear: Tympanic membrane normal.     Left Ear: Tympanic membrane normal.     Mouth/Throat:     Pharynx: No oropharyngeal exudate or posterior oropharyngeal erythema.  Cardiovascular:     Rate and Rhythm: Regular rhythm.  Pulmonary:     Effort: Pulmonary effort is normal.     Breath sounds: Normal breath sounds.  Skin:    Findings: No rash.  Neurological:     Mental Status: He is alert.        Assessment:     Febrile illness.  Suspect possible influenza.  He had positive exposure as above.  Nontoxic in appearance    Plan:     -We elected to go ahead and cover with Tamiflu 75 mg twice daily for 5 days  given positive exposure as above -Push fluids and plenty of rest -Tylenol or Motrin as needed for symptom relief -Follow-up for any persistent or worsening symptoms  Eulas Post MD [facility]

## 2019-02-24 ENCOUNTER — Telehealth: Payer: Self-pay

## 2019-02-24 NOTE — Telephone Encounter (Signed)
Copied from [place] [id number]. Topic: Appointment Scheduling - Transfer of Care >> Feb 24, 2019 12:19 PM Mathis Bud wrote: Pt is requesting to transfer FROM: Kim,Hannah  Pt is requesting to transfer TO: Raquel Sarna  Reason for requested transfer: Patient lives closer to [place]   Send CRM to patient's current PCP (transferring FROM).

## 2019-02-24 NOTE — Telephone Encounter (Signed)
I called the pt and informed him he can call the office of his choice to schedule an appt with a new primary care doctor as no approval is needed in this case.

## 2019-02-25 ENCOUNTER — Other Ambulatory Visit: Payer: Self-pay | Admitting: Family Medicine

## 2019-03-11 ENCOUNTER — Encounter: Payer: 59 | Admitting: Nurse Practitioner

## 2019-03-26 ENCOUNTER — Encounter: Payer: 59 | Admitting: Family Medicine

## 2019-03-28 ENCOUNTER — Encounter: Payer: 59 | Admitting: Family Medicine

## 2019-04-03 ENCOUNTER — Other Ambulatory Visit: Payer: Self-pay

## 2019-04-04 ENCOUNTER — Encounter: Payer: Self-pay | Admitting: Family Medicine

## 2019-04-04 ENCOUNTER — Ambulatory Visit (INDEPENDENT_AMBULATORY_CARE_PROVIDER_SITE_OTHER): Payer: 59 | Admitting: Family Medicine

## 2019-04-04 VITALS — BP 118/70 | HR 87 | Temp 97.8°F | Ht 73.0 in | Wt 207.6 lb

## 2019-04-04 DIAGNOSIS — Z23 Encounter for immunization: Secondary | ICD-10-CM

## 2019-04-04 DIAGNOSIS — Z0000 Encounter for general adult medical examination without abnormal findings: Secondary | ICD-10-CM | POA: Diagnosis not present

## 2019-04-04 DIAGNOSIS — Z87438 Personal history of other diseases of male genital organs: Secondary | ICD-10-CM

## 2019-04-04 LAB — BASIC METABOLIC PANEL
BUN: 13 mg/dL (ref 6–23)
CO2: 24 mEq/L (ref 19–32)
Calcium: 9.5 mg/dL (ref 8.4–10.5)
Chloride: 103 mEq/L (ref 96–112)
Creatinine, Ser: 0.98 mg/dL (ref 0.40–1.50)
GFR: 78.83 mL/min (ref >60.00)
Glucose, Bld: 90 mg/dL (ref 70–99)
Potassium: 3.7 mEq/L (ref 3.5–5.1)
Sodium: 136 mEq/L (ref 135–145)

## 2019-04-04 LAB — CBC
HCT: 48.1 % (ref 39.0–52.0)
Hemoglobin: 16.6 g/dL (ref 13.0–17.0)
MCHC: 34.5 g/dL (ref 30.0–36.0)
MCV: 89.4 fl (ref 78.0–100.0)
Platelets: 206 10*3/uL (ref 150.0–400.0)
RBC: 5.38 Mil/uL (ref 4.22–5.81)
RDW: 12.9 % (ref 11.5–15.5)
WBC: 8.5 10*3/uL (ref 4.0–10.5)

## 2019-04-04 LAB — LIPID PANEL
Cholesterol: 148 mg/dL (ref 0–200)
HDL: 52.4 mg/dL (ref >39.00)
LDL Cholesterol: 78 mg/dL (ref 0–99)
NonHDL: 96.04
Total CHOL/HDL Ratio: 3
Triglycerides: 88 mg/dL (ref 0.0–149.0)
VLDL: 17.6 mg/dL (ref 0.0–40.0)

## 2019-04-04 LAB — ALT: ALT: 22 U/L (ref 0–53)

## 2019-04-04 LAB — AST: AST: 18 U/L (ref 0–37)

## 2019-04-04 MED ORDER — TAMSULOSIN HCL 0.4 MG PO CAPS: 0.4000 mg | ORAL_CAPSULE | Freq: Every day | ORAL | 3 refills | Status: DC

## 2019-04-04 NOTE — Progress Notes (Signed)
Thomas Davidson is a 59 y.o. male  Chief Complaint  Patient presents with  . Establish Care    HPI: Thomas Davidson is a 59 y.o. male here as a transfer of care from [place]. He is here for annual CPE, fasting labs. He would like his flu vaccine today. Married (wife is my pt), 3 children, 6 grandchildren. Works as a Librarian, academic at [employer].  Last Colonoscopy: 10/2013 - due in 2025  Diet/Exercise: average diet, physically active  Med refills needed today: flomax for BPH  Past Medical History:  Diagnosis Date  . Allergy   . Asthma    as child    Past Surgical History:  Procedure Laterality Date  . NO PAST SURGERIES      Social History   Socioeconomic History  . Marital status: Married    Spouse name: Not on file  . Number of children: Not on file  . Years of education: Not on file  . Highest education level: Not on file  Occupational History  . Not on file  Tobacco Use  . Smoking status: Never Smoker  . Smokeless tobacco: Current User    Types: Chew  Substance and Sexual Activity  . Alcohol use: No  . Drug use: No  . Sexual activity: Not on file  Other Topics Concern  . Not on file  Social History Narrative    Married, 3 children, 6 grandchildren. Works as a Librarian, academic at [employer].      Social Determinants of Health   Financial Resource Strain:   . Difficulty of Paying Living Expenses: Not on file  Food Insecurity:   . Worried About Charity fundraiser in the Last Year: Not on file  . Ran Out of Food in the Last Year: Not on file  Transportation Needs:   . Lack of Transportation (Medical): Not on file  . Lack of Transportation (Non-Medical): Not on file  Physical Activity:   . Days of Exercise per Week: Not on file  . Minutes of Exercise per Session: Not on file  Stress:   . Feeling of Stress : Not on file  Social Connections:   . Frequency of Communication with Friends and Family: Not on file  . Frequency of Social Gatherings with Friends and  Family: Not on file  . Attends Religious Services: Not on file  . Active Member of Clubs or Organizations: Not on file  . Attends Archivist Meetings: Not on file  . Marital Status: Not on file  Intimate Partner Violence:   . Fear of Current or Ex-Partner: Not on file  . Emotionally Abused: Not on file  . Physically Abused: Not on file  . Sexually Abused: Not on file    Family History  Problem Relation Age of Onset  . Throat cancer Father        smoker  . Colon cancer Neg Hx      Immunization History  Administered Date(s) Administered  . Influenza Split 02/05/2012  . Influenza Whole 12/19/2006, 12/18/2008  . Influenza,inj,Quad PF,6+ Mos 02/05/2013, 01/26/2014  . Influenza-Unspecified 10/19/2014  . Td 10/20/2002  . Tdap 02/05/2013    Outpatient Encounter Medications as of 04/04/2019  Medication Sig  . cetirizine (ZYRTEC) 10 MG tablet Take 10 mg by mouth daily.  . tamsulosin (FLOMAX) 0.4 MG CAPS capsule Take 1 capsule (0.4 mg total) by mouth daily.  . [DISCONTINUED] oseltamivir (TAMIFLU) 75 MG capsule Take 1 capsule (75 mg total) by mouth  2 (two) times daily.  . [DISCONTINUED] tamsulosin (FLOMAX) 0.4 MG CAPS capsule TAKE 1 CAPSULE (0.4 MG TOTAL) BY MOUTH DAILY.   No facility-administered encounter medications on file as of 04/04/2019.     ROS: Gen: no fever, chills  Skin: no rash, itching ENT: no ear pain, ear drainage, nasal congestion, rhinorrhea, sinus pressure, sore throat Eyes: no blurry vision, double vision Resp: no cough, wheeze,SOB CV: no CP, palpitations, LE edema,  GI: no heartburn, n/v/d/c, abd pain GU: no dysuria, urgency, frequency, hematuria; no testicular swelling or masses MSK: no joint pain, myalgias, back pain Neuro: no dizziness, headache, weakness, vertigo Psych: no depression, anxiety, insomnia   Allergies  Allergen Reactions  . Bee Venom Anaphylaxis and Hives    BP 118/70   Pulse 87   Temp 97.8 F (36.6 C) (Temporal)   Ht 6'  1" (1.854 m)   Wt 207 lb 9.6 oz (94.2 kg)   SpO2 99%   BMI 27.39 kg/m   BP Readings from Last 3 Encounters:  04/04/19 118/70  05/22/18 122/70  06/28/17 110/78     Physical Exam  Constitutional: He is oriented to person, place, and time. He appears well-developed and well-nourished. No distress.  HENT:  Head: Normocephalic and atraumatic.  Right Ear: Tympanic membrane and ear canal normal.  Left Ear: Tympanic membrane and ear canal normal.  Nose: Nose normal.  Mouth/Throat: Oropharynx is clear and moist and mucous membranes are normal.  Neck: No thyromegaly present.  Cardiovascular: Normal rate, regular rhythm, normal heart sounds and intact distal pulses.  Pulmonary/Chest: Effort normal and breath sounds normal. He has no wheezes. He has no rhonchi.  Abdominal: Soft. Bowel sounds are normal. He exhibits no distension and no mass. There is no abdominal tenderness. There is no rebound and no guarding.  Musculoskeletal:        General: No edema. Normal range of motion.     Cervical back: Neck supple.  Lymphadenopathy:    He has no cervical adenopathy.  Neurological: He is alert and oriented to person, place, and time. He exhibits normal muscle tone. Coordination normal.  Skin: Skin is warm and dry.  Psychiatric: He has a normal mood and affect.     A/P:  1. History of BPH - stable Refill: - tamsulosin (FLOMAX) 0.4 MG CAPS capsule; Take 1 capsule (0.4 mg total) by mouth daily.  Dispense: 90 capsule; Refill: 3  2. Need for influenza vaccination - Flu Vaccine QUAD 6+ mos PF IM (Fluarix Quad PF)  3. Annual physical exam - dental and vision exams UTD - colonoscopy UTD, due in 2025 - discussed importance of regular CV exercise, healthy diet, adequate sleep - immunizations UTD - ALT - AST - Basic metabolic panel - CBC - Lipid panel - next CPE in 1 year

## 2019-04-04 NOTE — Patient Instructions (Signed)

## 2019-04-06 ENCOUNTER — Encounter: Payer: Self-pay | Admitting: Family Medicine

## 2020-04-11 ENCOUNTER — Other Ambulatory Visit: Payer: Self-pay | Admitting: Family Medicine

## 2020-04-11 DIAGNOSIS — Z87438 Personal history of other diseases of male genital organs: Secondary | ICD-10-CM

## 2021-03-19 ENCOUNTER — Emergency Department (HOSPITAL_BASED_OUTPATIENT_CLINIC_OR_DEPARTMENT_OTHER): Payer: 59

## 2021-03-19 ENCOUNTER — Encounter (HOSPITAL_BASED_OUTPATIENT_CLINIC_OR_DEPARTMENT_OTHER): Payer: Self-pay | Admitting: Emergency Medicine

## 2021-03-19 ENCOUNTER — Other Ambulatory Visit: Payer: Self-pay

## 2021-03-19 ENCOUNTER — Emergency Department (HOSPITAL_BASED_OUTPATIENT_CLINIC_OR_DEPARTMENT_OTHER)
Admission: EM | Admit: 2021-03-19 | Discharge: 2021-03-19 | Disposition: A | Discharge status: Home / Self Care | Payer: 59 | Attending: Emergency Medicine | Admitting: Emergency Medicine

## 2021-03-19 DIAGNOSIS — J45909 Unspecified asthma, uncomplicated: Secondary | ICD-10-CM | POA: Diagnosis not present

## 2021-03-19 DIAGNOSIS — M79671 Pain in right foot: Secondary | ICD-10-CM | POA: Insufficient documentation

## 2021-03-19 DIAGNOSIS — F17220 Nicotine dependence, chewing tobacco, uncomplicated: Secondary | ICD-10-CM | POA: Insufficient documentation

## 2021-03-19 LAB — CBC WITH DIFFERENTIAL/PLATELET
Abs Immature Granulocytes: 0.04 10*3/uL (ref 0.00–0.07)
Basophils Absolute: 0.1 10*3/uL (ref 0.0–0.1)
Basophils Relative: 1 %
Eosinophils Absolute: 0.2 10*3/uL (ref 0.0–0.5)
Eosinophils Relative: 2 %
HCT: 43.9 % (ref 39.0–52.0)
Hemoglobin: 15.7 g/dL (ref 13.0–17.0)
Immature Granulocytes: 0 %
Lymphocytes Relative: 13 %
Lymphs Abs: 1.3 10*3/uL (ref 0.7–4.0)
MCH: 30.5 pg (ref 26.0–34.0)
MCHC: 35.8 g/dL (ref 30.0–36.0)
MCV: 85.2 fL (ref 80.0–100.0)
Monocytes Absolute: 1 10*3/uL (ref 0.1–1.0)
Monocytes Relative: 10 %
Neutro Abs: 7.7 10*3/uL (ref 1.7–7.7)
Neutrophils Relative %: 74 %
Platelets: 225 10*3/uL (ref 150–400)
RBC: 5.15 MIL/uL (ref 4.22–5.81)
RDW: 12.1 % (ref 11.5–15.5)
WBC: 10.4 10*3/uL (ref 4.0–10.5)
nRBC: 0 % (ref 0.0–0.2)

## 2021-03-19 LAB — COMPREHENSIVE METABOLIC PANEL
ALT: 18 U/L (ref 0–44)
AST: 18 U/L (ref 15–41)
Albumin: 3.8 g/dL (ref 3.5–5.0)
Alkaline Phosphatase: 69 U/L (ref 38–126)
Anion gap: 10 (ref 5–15)
BUN: 8 mg/dL (ref 6–20)
CO2: 21 mmol/L — ABNORMAL LOW (ref 22–32)
Calcium: 8.9 mg/dL (ref 8.9–10.3)
Chloride: 105 mmol/L (ref 98–111)
Creatinine, Ser: 0.9 mg/dL (ref 0.61–1.24)
GFR, Estimated: >60 mL/min (ref >60)
Glucose, Bld: 115 mg/dL — ABNORMAL HIGH (ref 70–99)
Potassium: 3.7 mmol/L (ref 3.5–5.1)
Sodium: 136 mmol/L (ref 135–145)
Total Bilirubin: 0.6 mg/dL (ref 0.3–1.2)
Total Protein: 7.7 g/dL (ref 6.5–8.1)

## 2021-03-19 LAB — SEDIMENTATION RATE: Sed Rate: 12 mm/hr (ref 0–16)

## 2021-03-19 LAB — URIC ACID: Uric Acid, Serum: 5.7 mg/dL (ref 3.7–8.6)

## 2021-03-19 MED ORDER — SODIUM CHLORIDE 0.9 % IV SOLN
1.0000 g | Freq: Once | INTRAVENOUS | Status: AC
  Administered 2021-03-19: 1 g via INTRAVENOUS
  Filled 2021-03-19: qty 10

## 2021-03-19 MED ORDER — SODIUM CHLORIDE 0.9 % IV SOLN: INTRAVENOUS | Status: DC | PRN

## 2021-03-19 MED ORDER — CEPHALEXIN 500 MG PO CAPS: 500.0000 mg | ORAL_CAPSULE | Freq: Four times a day (QID) | ORAL | 0 refills | Status: AC

## 2021-03-19 MED ORDER — HYDROCODONE-ACETAMINOPHEN 5-325 MG PO TABS
1.0000 | ORAL_TABLET | Freq: Once | ORAL | Status: AC
  Administered 2021-03-19: 1 via ORAL
  Filled 2021-03-19: qty 1

## 2021-03-19 MED ORDER — KETOROLAC TROMETHAMINE 15 MG/ML IJ SOLN
15.0000 mg | Freq: Once | INTRAMUSCULAR | Status: AC
  Administered 2021-03-19: 15 mg via INTRAVENOUS
  Filled 2021-03-19: qty 1

## 2021-03-19 NOTE — Discharge Instructions (Signed)
Follow up with your podiatrist for reassessment.

## 2021-03-19 NOTE — ED Provider Notes (Signed)
[facility] EMERGENCY DEPARTMENT Provider Note   CSN: [id number] Arrival date & time: 03/19/21  1409     History No chief complaint on file.   Thomas Davidson is a 60 y.o. male who presents emergency department chief complaint of right foot pain.  Patient states that he was in his normal state of health prior to going to bed last night.  He had some aching in his first metatarsal phalangeal joint.  He states that around 4 AM he woke up with severe pain in his right foot.  He can even stand to have the sheet on it.  He has had no injury.  He has never had anything like this before.  He does not drink alcohol.  He states that he only eats red meat occasionally.  He does not take hydrochlorothiazide.  He denies chest pain, shortness of breath.  HPI     Past Medical History:  Diagnosis Date   Allergy    Asthma    as child    Patient Active Problem List   Diagnosis Date Noted   History of BPH 06/12/2016   Neuroma 04/05/2015   Allergic rhinitis 11/19/2008    Past Surgical History:  Procedure Laterality Date   NO PAST SURGERIES         Family History  Problem Relation Age of Onset   Throat cancer Father        smoker   Colon cancer Neg Hx     Social History   Tobacco Use   Smoking status: Never   Smokeless tobacco: Current    Types: Chew  Substance Use Topics   Alcohol use: No   Drug use: No    Home Medications Prior to Admission medications   Medication Sig Start Date End Date Taking? Authorizing Provider  cetirizine (ZYRTEC) 10 MG tablet Take 10 mg by mouth daily.    [provider]  tamsulosin (FLOMAX) 0.4 MG CAPS capsule TAKE 1 CAPSULE BY MOUTH EVERY DAY 04/12/20   Cirigliano, Garvin Fila, DO    Allergies    Bee venom  Review of Systems   Review of Systems Ten systems reviewed and are negative for acute change, except as noted in the HPI.   Physical Exam Updated Vital Signs BP (!) 152/97    Pulse (!) 106    Temp 98.3 F (36.8 C) (Oral)     Resp 16    Ht 6\' 1"  (1.854 m)    Wt 93 kg    SpO2 100%    BMI 27.05 kg/m   Physical Exam Vitals and nursing note reviewed.  Constitutional:      General: He is not in acute distress.    Appearance: He is well-developed. He is not diaphoretic.  HENT:     Head: Normocephalic and atraumatic.  Eyes:     General: No scleral icterus.    Conjunctiva/sclera: Conjunctivae normal.  Cardiovascular:     Rate and Rhythm: Normal rate and regular rhythm.     Heart sounds: Normal heart sounds.  Pulmonary:     Effort: Pulmonary effort is normal. No respiratory distress.     Breath sounds: Normal breath sounds.  Abdominal:     Palpations: Abdomen is soft.     Tenderness: There is no abdominal tenderness.  Musculoskeletal:     Cervical back: Normal range of motion and neck supple.     Comments: Right foot is globally swollen.  He is exquisitely tender in the right first metacarpophalangeal  and right fifth metacarpal phalangeal joint.  He has bounding DP and PT pulses.  Right foot is warm to the touch as compared to the left.  Normal right ankle examination  Skin:    General: Skin is warm and dry.  Neurological:     Mental Status: He is alert.  Psychiatric:        Behavior: Behavior normal.    ED Results / Procedures / Treatments   Labs (all labs ordered are listed, but only abnormal results are displayed) Labs Reviewed  CBC WITH DIFFERENTIAL/PLATELET  COMPREHENSIVE METABOLIC PANEL  C-REACTIVE PROTEIN  SEDIMENTATION RATE  URIC ACID    EKG None  Radiology DG Foot Complete Right  Result Date: 03/19/2021 CLINICAL DATA:  Right foot pain today, no injury; pain woke pt up this morning, burning pain from mid foot to toes. Veins on top of the foot were dilated this morning per pt. No hx of gout EXAM: RIGHT FOOT COMPLETE - 3+ VIEW COMPARISON:  04/05/2015. FINDINGS: There is no evidence of fracture or dislocation. There is no evidence of arthropathy or other focal bone abnormality. Soft  tissues are unremarkable. IMPRESSION: Negative. Electronically Signed   By: Lajean Manes M.D.   On: 03/19/2021 15:20    Procedures Procedures   Medications Ordered in ED Medications  cefTRIAXone (ROCEPHIN) 1 g in sodium chloride 0.9 % 100 mL IVPB (has no administration in time range)  HYDROcodone-acetaminophen (NORCO/VICODIN) 5-325 MG per tablet 1 tablet (has no administration in time range)  ketorolac (TORADOL) 15 MG/ML injection 15 mg (has no administration in time range)    ED Course  I have reviewed the triage vital signs and the nursing notes.  Pertinent labs & imaging results that were available during my care of the patient were reviewed by me and considered in my medical decision making (see chart for details).    MDM Rules/Calculators/A&P                         Patient here with complaint of right foot pain.  I suspect acute gout.  I did ask Dr. Doren Custard to see the patient he has concern for potential cellulitis.  He has ordered labs.  Pain medication ordered.  He is also ordered Rocephin.  I reviewed a right foot image that is negative for any acute abnormalities.  Signout given to Dr. Doren Custard at shift handoff.  Final Clinical Impression(s) / ED Diagnoses Final diagnoses:  None    Rx / DC Orders ED Discharge Orders     None        Margarita Mail, PA-C 03/19/21 1946    Godfrey Pick, MD 03/20/21 1600

## 2021-03-19 NOTE — ED Notes (Signed)
Discharge instructions discussed with pt. Pt verbalized understanding. Pt stable and ambulatory.  °

## 2021-03-19 NOTE — ED Triage Notes (Signed)
Pt arrives pov with c/o right foot pain starting last night. Denies injury, denies swelling. Took ibuprofen at 1030 400 mg

## 2021-03-20 LAB — C-REACTIVE PROTEIN: CRP: 5.5 mg/dL — ABNORMAL HIGH (ref <1.0)

## 2021-03-24 ENCOUNTER — Ambulatory Visit: Payer: 59 | Admitting: Podiatry

## 2021-03-24 ENCOUNTER — Other Ambulatory Visit: Payer: Self-pay

## 2021-03-24 ENCOUNTER — Encounter: Payer: Self-pay | Admitting: Podiatry

## 2021-03-24 DIAGNOSIS — M7671 Peroneal tendinitis, right leg: Secondary | ICD-10-CM

## 2021-03-24 MED ORDER — TRIAMCINOLONE ACETONIDE 10 MG/ML IJ SUSP
10.0000 mg | Freq: Once | INTRAMUSCULAR | Status: AC
  Administered 2021-03-24: 10 mg

## 2021-03-25 NOTE — Progress Notes (Signed)
Subjective:   Patient ID: Thomas Davidson, male   DOB: 61 y.o.   MRN: [id number]   HPI Patient states he had severe pain in the side of his right foot that did not allow him to bear weight on it about 5 days ago.  Does not remember specific injury it just became intensely sore he did go to the emergency room where they gave him anti-inflammatories pain medicine and it improved it mildly but he still has pain on the outside of the foot.  Does not remember injury does not smoke likes to be active   Review of Systems  All other systems reviewed and are negative.      Objective:  Physical Exam Vitals and nursing note reviewed.  Constitutional:      Appearance: He is well-developed.  Pulmonary:     Effort: Pulmonary effort is normal.  Musculoskeletal:        General: Normal range of motion.  Skin:    General: Skin is warm.  Neurological:     Mental Status: He is alert.    Neurovascular status intact muscle strength found to be adequate range of motion found to be adequate with inflammation pain around the side of the right foot around the peroneal base and slightly proximal with +1 pitting edema around the area.  I did not note any strength loss of the peroneal tendon when tested and everything else appears to be within normal range      Assessment:  Probability for localized inflammatory condition versus systemic condition which I cannot make determination of at this time with patient having had blood work which did not indicate systemic inflammatory condition.  Probability for peroneal tendinitis     Plan:  H&P x-rays reviewed condition discussed.  At this point I did go ahead I did sterile prep I injected the peroneal sheath near its insertion into base of fifth metatarsal 3 mg Kenalog 5 mg Xylocaine and I advised on ice therapy anti-inflammatories.  Reappoint for [place] to recheck again as needed  X-rays indicate that there is some reactivity but I did not indicate any fracture or bony  process around the base of the fifth metatarsal right

## 2021-04-24 NOTE — Progress Notes (Signed)
BP 136/90 (BP Location: Right Arm, Cuff Size: Large)    Pulse 92    Temp (!) 97 F (36.1 C) (Temporal)    Ht 6\' 1"  (1.854 m)    Wt 214 lb 6.4 oz (97.3 kg)    SpO2 98%    BMI 28.29 kg/m    Subjective:    Patient ID: Thomas Davidson, male    DOB: [birth year], 61 y.o.   MRN: [id number]  CC: Chief Complaint  Patient presents with   Establish Care    Toc. Pt requesting CPE. Pt c/o of pain in both upper arms area x3 months. Pt is fasting.     HPI: Thomas Davidson is a 61 y.o. male presenting on 04/25/2021 for comprehensive medical examination. Current medical complaints include: pain in both upper arms  ARM PAIN  Duration: 3 months Location: bilateral Mechanism of injury: unknown Onset: gradual Severity: 7/10  Quality:  sharp Frequency: intermittent Radiation: no Aggravating factors: prolonged sitting  Alleviating factors: ibuprofen  Status: fluctuating Treatments attempted: rest and ibuprofen  Relief with NSAIDs?:  mild Swelling: no Redness: no  Warmth: no Trauma: no Chest pain: no  Shortness of breath: no  Fever: no Decreased sensation: no Paresthesias: no Weakness: yes - when hurting  He currently lives with: wife Interim Problems from his last visit:  see above  Depression Screen done today and results listed below:  Depression screen [facility] 2/9 04/25/2021 06/28/2017 06/12/2016  Decreased Interest 0 0 0  Down, Depressed, Hopeless 0 0 0  PHQ - 2 Score 0 0 0    The patient does not have a history of falls. I did not complete a risk assessment for falls. A plan of care for falls was not documented.   Past Medical History:  Past Medical History:  Diagnosis Date   Allergy    Asthma    as child   BPH (benign prostatic hyperplasia)     Surgical History:  Past Surgical History:  Procedure Laterality Date   NO PAST SURGERIES      Medications:  Current Outpatient Medications on File Prior to Visit  Medication Sig   aspirin 81 MG EC tablet Take 81 mg by mouth daily.  Swallow whole.   cetirizine (ZYRTEC) 10 MG tablet Take 10 mg by mouth daily.   No current facility-administered medications on file prior to visit.    Allergies:  Allergies  Allergen Reactions   Bee Venom Anaphylaxis and Hives    Social History:  Social History   Socioeconomic History   Marital status: Married    Spouse name: Not on file   Number of children: Not on file   Years of education: Not on file   Highest education level: Not on file  Occupational History   Not on file  Tobacco Use   Smoking status: Never   Smokeless tobacco: Current    Types: Chew  Substance and Sexual Activity   Alcohol use: No   Drug use: No   Sexual activity: Not on file  Other Topics Concern   Not on file  Social History Narrative    Married, 3 children, 6 grandchildren. Works as a Librarian, academic at [employer].      Social Determinants of Health   Financial Resource Strain: Not on file  Food Insecurity: Not on file  Transportation Needs: Not on file  Physical Activity: Not on file  Stress: Not on file  Social Connections: Not on file  Intimate Partner Violence: Not on  file   Social History   Tobacco Use  Smoking Status Never  Smokeless Tobacco Current   Types: Chew   Social History   Substance and Sexual Activity  Alcohol Use No    Family History:  Family History  Problem Relation Age of Onset   Throat cancer Father        smoker   Diabetes Maternal Grandfather    Colon cancer Neg Hx     Past medical history, surgical history, medications, allergies, family history and social history reviewed with patient today and changes made to appropriate areas of the chart.   Review of Systems  Constitutional: Negative.   HENT: Negative.    Eyes: Negative.   Respiratory: Negative.    Cardiovascular: Negative.   Gastrointestinal: Negative.   Genitourinary: Negative.   Musculoskeletal:  Positive for myalgias.  Skin: Negative.   Neurological: Negative.    Endo/Heme/Allergies:  Positive for environmental allergies. Does not bruise/bleed easily.  Psychiatric/Behavioral: Negative.    All other ROS negative except what is listed above and in the HPI.      Objective:    BP 136/90 (BP Location: Right Arm, Cuff Size: Large)    Pulse 92    Temp (!) 97 F (36.1 C) (Temporal)    Ht 6\' 1"  (1.854 m)    Wt 214 lb 6.4 oz (97.3 kg)    SpO2 98%    BMI 28.29 kg/m   Wt Readings from Last 3 Encounters:  04/25/21 214 lb 6.4 oz (97.3 kg)  03/19/21 205 lb (93 kg)  04/04/19 207 lb 9.6 oz (94.2 kg)    Physical Exam Vitals and nursing note reviewed.  Constitutional:      Appearance: Normal appearance.  HENT:     Head: Normocephalic and atraumatic.     Right Ear: Tympanic membrane, ear canal and external ear normal.     Left Ear: Tympanic membrane, ear canal and external ear normal.     Nose: Nose normal.     Mouth/Throat:     Mouth: Mucous membranes are moist.     Pharynx: Oropharynx is clear.  Eyes:     Conjunctiva/sclera: Conjunctivae normal.  Cardiovascular:     Rate and Rhythm: Normal rate and regular rhythm.     Pulses: Normal pulses.     Heart sounds: Normal heart sounds.  Pulmonary:     Effort: Pulmonary effort is normal.     Breath sounds: Normal breath sounds.  Abdominal:     General: Bowel sounds are normal.     Palpations: Abdomen is soft.     Tenderness: There is no abdominal tenderness.  Musculoskeletal:        General: No swelling or tenderness. Normal range of motion.     Cervical back: Normal range of motion and neck supple. No tenderness.     Right lower leg: No edema.     Left lower leg: No edema.  Lymphadenopathy:     Cervical: No cervical adenopathy.  Skin:    General: Skin is warm and dry.  Neurological:     General: No focal deficit present.     Mental Status: He is alert and oriented to person, place, and time.     Cranial Nerves: No cranial nerve deficit.     Gait: Gait normal.     Deep Tendon Reflexes: Reflexes  normal.  Psychiatric:        Mood and Affect: Mood normal.        Behavior: Behavior normal.  Thought Content: Thought content normal.        Judgment: Judgment normal.    Results for orders placed or performed during the hospital encounter of 03/19/21  CBC with Differential  Result Value Ref Range   WBC 10.4 4.0 - 10.5 K/uL   RBC 5.15 4.22 - 5.81 MIL/uL   Hemoglobin 15.7 13.0 - 17.0 g/dL   HCT 43.9 39.0 - 52.0 %   MCV 85.2 80.0 - 100.0 fL   MCH 30.5 26.0 - 34.0 pg   MCHC 35.8 30.0 - 36.0 g/dL   RDW 12.1 11.5 - 15.5 %   Platelets 225 150 - 400 K/uL   nRBC 0.0 0.0 - 0.2 %   Neutrophils Relative % 74 %   Neutro Abs 7.7 1.7 - 7.7 K/uL   Lymphocytes Relative 13 %   Lymphs Abs 1.3 0.7 - 4.0 K/uL   Monocytes Relative 10 %   Monocytes Absolute 1.0 0.1 - 1.0 K/uL   Eosinophils Relative 2 %   Eosinophils Absolute 0.2 0.0 - 0.5 K/uL   Basophils Relative 1 %   Basophils Absolute 0.1 0.0 - 0.1 K/uL   Immature Granulocytes 0 %   Abs Immature Granulocytes 0.04 0.00 - 0.07 K/uL  Comprehensive metabolic panel  Result Value Ref Range   Sodium 136 135 - 145 mmol/L   Potassium 3.7 3.5 - 5.1 mmol/L   Chloride 105 98 - 111 mmol/L   CO2 21 (L) 22 - 32 mmol/L   Glucose, Bld 115 (H) 70 - 99 mg/dL   BUN 8 6 - 20 mg/dL   Creatinine, Ser 0.90 0.61 - 1.24 mg/dL   Calcium 8.9 8.9 - 10.3 mg/dL   Total Protein 7.7 6.5 - 8.1 g/dL   Albumin 3.8 3.5 - 5.0 g/dL   AST 18 15 - 41 U/L   ALT 18 0 - 44 U/L   Alkaline Phosphatase 69 38 - 126 U/L   Total Bilirubin 0.6 0.3 - 1.2 mg/dL   GFR, Estimated >60 >60 mL/min   Anion gap 10 5 - 15  C-reactive protein  Result Value Ref Range   CRP 5.5 (H) <1.0 mg/dL  Sedimentation rate  Result Value Ref Range   Sed Rate 12 0 - 16 mm/hr  Uric acid  Result Value Ref Range   Uric Acid, Serum 5.7 3.7 - 8.6 mg/dL      Assessment & Plan:   Problem List Items Addressed This Visit       Other   History of BPH    Chronic, stable. Well controlled with  flomax 0.4mg  daily. Check PSA today. Refill sent to pharmacy.       Relevant Medications   tamsulosin (FLOMAX) 0.4 MG CAPS capsule   Other Relevant Orders   PSA   Pain in both upper arms    Chronic x3 months. States he normally sleeps with 2 pillows. Pain is worse in the mornings or after sitting and not moving for a long period of time. May be caused by pinched nerve in his neck. Exercises given to perform daily. Can use voltaren gel or ibuprofen OTC as needed for pain. Try sleeping with 1 pillow at night. Follow up if symptoms aren't improving or with any concerns. If having ongoing symptoms, will get imaging.       Elevated blood pressure reading    Blood pressure 136/90 on recheck in office. Discussed limiting salt, diet and exercise. He does snore at night and had been tested for sleep apnea  in the past and was told he didn't need treatment. Symptoms have not worsened. Offered repeat home sleep study and he declined.       Other Visit Diagnoses     Routine general medical examination at a health care facility    -  Primary   Health maintenance reviewed and updated. Check CMP, CBC, TSH today. First shingrix vaccine given today   Relevant Orders   CBC with Differential/Platelet   Comprehensive metabolic panel   TSH   Encounter for lipid screening for cardiovascular disease       Check lipid panel today and treat based on results   Relevant Orders   Lipid panel   Need for shingles vaccine       Shingrix #1 given today   Relevant Orders   Varicella-zoster vaccine IM (Shingrix) (Completed)   Screening for HIV (human immunodeficiency virus)       Screen HIV today   Relevant Orders   HIV Antibody (routine testing w rflx)        Discussed aspirin prophylaxis for myocardial infarction prevention and decision was made to continue ASA  LABORATORY TESTING:  Health maintenance labs ordered today as discussed above.   The natural history of prostate cancer and ongoing controversy  regarding screening and potential treatment outcomes of prostate cancer has been discussed with the patient. The meaning of a false positive PSA and a false negative PSA has been discussed. He indicates understanding of the limitations of this screening test and wishes to proceed with screening PSA testing.   IMMUNIZATIONS:   - Tdap: Tetanus vaccination status reviewed: last tetanus booster within 10 years. - Influenza: Up to date - Pneumovax: Not applicable - Prevnar: Not applicable - HPV: Not applicable - Zostavax vaccine: Administered today  SCREENING: - Colonoscopy: Up to date  Discussed with patient purpose of the colonoscopy is to detect colon cancer at curable precancerous or early stages   - AAA Screening: Not applicable  -Hearing Test: Not applicable  -Spirometry: Not applicable   PATIENT COUNSELING:    Sexuality: Discussed sexually transmitted diseases, partner selection, use of condoms, avoidance of unintended pregnancy  and contraceptive alternatives.   Advised to avoid cigarette smoking.  I discussed with the patient that most people either abstain from alcohol or drink within safe limits (<=14/week and <=4 drinks/occasion for males, <=7/weeks and <= 3 drinks/occasion for females) and that the risk for alcohol disorders and other health effects rises proportionally with the number of drinks per week and how often a drinker exceeds daily limits.  Discussed cessation/primary prevention of drug use and availability of treatment for abuse.   Diet: Encouraged to adjust caloric intake to maintain  or achieve ideal body weight, to reduce intake of dietary saturated fat and total fat, to limit sodium intake by avoiding high sodium foods and not adding table salt, and to maintain adequate dietary potassium and calcium preferably from fresh fruits, vegetables, and low-fat dairy products.    stressed the importance of regular exercise  Injury prevention: Discussed safety belts,  safety helmets, smoke detector, smoking near bedding or upholstery.   Dental health: Discussed importance of regular tooth brushing, flossing, and dental visits.   Follow up plan: NEXT PREVENTATIVE PHYSICAL DUE IN 1 YEAR. Return in about 1 year (around 04/25/2022) for CPE.

## 2021-04-25 ENCOUNTER — Other Ambulatory Visit: Payer: Self-pay

## 2021-04-25 ENCOUNTER — Encounter: Payer: Self-pay | Admitting: Nurse Practitioner

## 2021-04-25 ENCOUNTER — Ambulatory Visit (INDEPENDENT_AMBULATORY_CARE_PROVIDER_SITE_OTHER): Payer: 59 | Admitting: Nurse Practitioner

## 2021-04-25 VITALS — BP 136/90 | HR 92 | Temp 97.0°F | Ht 73.0 in | Wt 214.4 lb

## 2021-04-25 DIAGNOSIS — Z0000 Encounter for general adult medical examination without abnormal findings: Secondary | ICD-10-CM

## 2021-04-25 DIAGNOSIS — R030 Elevated blood-pressure reading, without diagnosis of hypertension: Secondary | ICD-10-CM

## 2021-04-25 DIAGNOSIS — Z23 Encounter for immunization: Secondary | ICD-10-CM

## 2021-04-25 DIAGNOSIS — Z114 Encounter for screening for human immunodeficiency virus [HIV]: Secondary | ICD-10-CM

## 2021-04-25 DIAGNOSIS — Z136 Encounter for screening for cardiovascular disorders: Secondary | ICD-10-CM

## 2021-04-25 DIAGNOSIS — Z87438 Personal history of other diseases of male genital organs: Secondary | ICD-10-CM | POA: Diagnosis not present

## 2021-04-25 DIAGNOSIS — M79622 Pain in left upper arm: Secondary | ICD-10-CM

## 2021-04-25 DIAGNOSIS — Z13220 Encounter for screening for lipoid disorders: Secondary | ICD-10-CM

## 2021-04-25 DIAGNOSIS — M79621 Pain in right upper arm: Secondary | ICD-10-CM

## 2021-04-25 LAB — COMPREHENSIVE METABOLIC PANEL
ALT: 13 U/L (ref 0–53)
AST: 14 U/L (ref 0–37)
Albumin: 4 g/dL (ref 3.5–5.2)
Alkaline Phosphatase: 80 U/L (ref 39–117)
BUN: 14 mg/dL (ref 6–23)
CO2: 29 mEq/L (ref 19–32)
Calcium: 9 mg/dL (ref 8.4–10.5)
Chloride: 102 mEq/L (ref 96–112)
Creatinine, Ser: 0.92 mg/dL (ref 0.40–1.50)
GFR: 91.32 mL/min (ref >60.00)
Glucose, Bld: 84 mg/dL (ref 70–99)
Potassium: 3.9 mEq/L (ref 3.5–5.1)
Sodium: 137 mEq/L (ref 135–145)
Total Bilirubin: 0.8 mg/dL (ref 0.2–1.2)
Total Protein: 7.2 g/dL (ref 6.0–8.3)

## 2021-04-25 LAB — LIPID PANEL
Cholesterol: 117 mg/dL (ref 0–200)
HDL: 44.9 mg/dL (ref >39.00)
LDL Cholesterol: 59 mg/dL (ref 0–99)
NonHDL: 72.53
Total CHOL/HDL Ratio: 3
Triglycerides: 69 mg/dL (ref 0.0–149.0)
VLDL: 13.8 mg/dL (ref 0.0–40.0)

## 2021-04-25 LAB — CBC WITH DIFFERENTIAL/PLATELET
Basophils Absolute: 0.1 10*3/uL (ref 0.0–0.1)
Basophils Relative: 0.8 % (ref 0.0–3.0)
Eosinophils Absolute: 0.3 10*3/uL (ref 0.0–0.7)
Eosinophils Relative: 4.6 % (ref 0.0–5.0)
HCT: 44.5 % (ref 39.0–52.0)
Hemoglobin: 15.4 g/dL (ref 13.0–17.0)
Lymphocytes Relative: 16.5 % (ref 12.0–46.0)
Lymphs Abs: 1.2 10*3/uL (ref 0.7–4.0)
MCHC: 34.5 g/dL (ref 30.0–36.0)
MCV: 90.2 fl (ref 78.0–100.0)
Monocytes Absolute: 0.6 10*3/uL (ref 0.1–1.0)
Monocytes Relative: 8.7 % (ref 3.0–12.0)
Neutro Abs: 4.9 10*3/uL (ref 1.4–7.7)
Neutrophils Relative %: 69.4 % (ref 43.0–77.0)
Platelets: 202 10*3/uL (ref 150.0–400.0)
RBC: 4.94 Mil/uL (ref 4.22–5.81)
RDW: 12.9 % (ref 11.5–15.5)
WBC: 7.1 10*3/uL (ref 4.0–10.5)

## 2021-04-25 LAB — TSH: TSH: 1.82 u[IU]/mL (ref 0.35–5.50)

## 2021-04-25 LAB — PSA: PSA: 0.68 ng/mL (ref 0.10–4.00)

## 2021-04-25 MED ORDER — TAMSULOSIN HCL 0.4 MG PO CAPS: 0.4000 mg | ORAL_CAPSULE | Freq: Every day | ORAL | 3 refills | Status: DC

## 2021-04-25 NOTE — Assessment & Plan Note (Signed)
Chronic, stable. Well controlled with flomax 0.4mg  daily. Check PSA today. Refill sent to pharmacy.

## 2021-04-25 NOTE — Assessment & Plan Note (Signed)
Blood pressure 136/90 on recheck in office. Discussed limiting salt, diet and exercise. He does snore at night and had been tested for sleep apnea in the past and was told he didn't need treatment. Symptoms have not worsened. Offered repeat home sleep study and he declined.

## 2021-04-25 NOTE — Patient Instructions (Signed)
It was great to see you!  We are checking routine labs and will let you know the results via mychart/phone.   You can start voltaren gel 4 times a day as needed for pain, you can use it on your arms/neck. Try sleeping with one less pillow. I have printed some stretches for your neck that you can do daily to help with the pain.   Let's follow-up in 1 year, sooner if you have concerns. You will come back for a nurse visit in 2-6 months for your second shingrix vaccine.  If a referral was placed today, you will be contacted for an appointment. Please note that routine referrals can sometimes take up to 3-4 weeks to process. Please call our office if you haven't heard anything after this time frame.  Take care,  Vance Peper, NP

## 2021-04-25 NOTE — Assessment & Plan Note (Addendum)
Chronic x3 months. States he normally sleeps with 2 pillows. Pain is worse in the mornings or after sitting and not moving for a long period of time. May be caused by pinched nerve in his neck. Exercises given to perform daily. Can use voltaren gel or ibuprofen OTC as needed for pain. Try sleeping with 1 pillow at night. Follow up if symptoms aren't improving or with any concerns. If having ongoing symptoms, will get imaging.

## 2021-04-26 LAB — HIV ANTIBODY (ROUTINE TESTING W REFLEX): HIV 1&2 Ab, 4th Generation: NONREACTIVE

## 2021-06-28 NOTE — Progress Notes (Signed)
? ?Acute Office Visit ? ?Subjective:  ? ? Patient ID: Thomas Davidson, male    DOB: 1960-09-04, 61 y.o.   MRN: [id number] ? ?Chief Complaint  ?Patient presents with  ? Follow-up  ?  Pt c/o swelling, tightness and some pain in fingers on both hands x1 month, no numbness  ? ? ?HPI ?Patient is in today for swelling and stiffness in his fingers. It is mainly in his index and middle fingers on both hands. He also has been having pain in his shoulders. He went to an orthopedic who gave him shoulder injections which helped with the pain and swelling in his shoulders and hands, however the swelling and stiffness returned to his fingers.  The stiffness is worse in the morning, however does improve throughout the day.  He also notices that his arms are more weaker than they normally are.  He denies fevers, rashes. ? ? ?Past Medical History:  ?Diagnosis Date  ? Allergy   ? Asthma   ? as child  ? BPH (benign prostatic hyperplasia)   ? ? ?Past Surgical History:  ?Procedure Laterality Date  ? NO PAST SURGERIES    ? ? ?Family History  ?Problem Relation Age of Onset  ? Throat cancer Father   ?     smoker  ? Diabetes Maternal Grandfather   ? Colon cancer Neg Hx   ? ? ?Social History  ? ?Socioeconomic History  ? Marital status: Married  ?  Spouse name: Not on file  ? Number of children: Not on file  ? Years of education: Not on file  ? Highest education level: Not on file  ?Occupational History  ? Not on file  ?Tobacco Use  ? Smoking status: Never  ? Smokeless tobacco: Current  ?  Types: Chew  ?Substance and Sexual Activity  ? Alcohol use: No  ? Drug use: No  ? Sexual activity: Not on file  ?Other Topics Concern  ? Not on file  ?Social History Narrative  ?  Married, 3 children, 6 grandchildren. Works as a Librarian, academic at [employer].  ?   ? ?Social Determinants of Health  ? ?Financial Resource Strain: Not on file  ?Food Insecurity: Not on file  ?Transportation Needs: Not on file  ?Physical Activity: Not on file  ?Stress: Not on file  ?Social  Connections: Not on file  ?Intimate Partner Violence: Not on file  ? ? ?Outpatient Medications Prior to Visit  ?Medication Sig Dispense Refill  ? aspirin 81 MG EC tablet Take 81 mg by mouth daily. Swallow whole.    ? cetirizine (ZYRTEC) 10 MG tablet Take 10 mg by mouth daily.    ? meloxicam (MOBIC) 15 MG tablet Take 15 mg by mouth daily.    ? tamsulosin (FLOMAX) 0.4 MG CAPS capsule Take 1 capsule (0.4 mg total) by mouth daily. 90 capsule 3  ? ?No facility-administered medications prior to visit.  ? ? ?Allergies  ?Allergen Reactions  ? Bee Venom Anaphylaxis and Hives  ? ? ?Review of Systems ?See pertinent positives and negatives per HPI. ?   ?Objective:  ?  ?Physical Exam ?Vitals and nursing note reviewed.  ?Constitutional:   ?   Appearance: Normal appearance.  ?HENT:  ?   Head: Normocephalic.  ?Eyes:  ?   Conjunctiva/sclera: Conjunctivae normal.  ?Cardiovascular:  ?   Rate and Rhythm: Normal rate and regular rhythm.  ?   Pulses: Normal pulses.  ?   Heart sounds: Normal heart sounds.  ?Pulmonary:  ?  Effort: Pulmonary effort is normal.  ?   Breath sounds: Normal breath sounds.  ?Musculoskeletal:     ?   General: Swelling present.  ?   Cervical back: Normal range of motion.  ?   Comments: Swelling and limited range of motion to his index and middle fingers on both hands.  Slight discoloration of the tips of his fingers, lighter than the rest of his skin.  ?Skin: ?   General: Skin is warm.  ?Neurological:  ?   General: No focal deficit present.  ?   Mental Status: He is alert and oriented to person, place, and time.  ?Psychiatric:     ?   Mood and Affect: Mood normal.     ?   Behavior: Behavior normal.     ?   Thought Content: Thought content normal.     ?   Judgment: Judgment normal.  ? ? ?BP 138/84 (BP Location: Right Arm, Cuff Size: Normal)   Pulse (!) 111   Temp (!) 97.5 ?F (36.4 ?C) (Temporal)   Wt 218 lb 12.8 oz (99.2 kg)   SpO2 98%   BMI 28.87 kg/m?  ?Wt Readings from Last 3 Encounters:  ?06/29/21 218 lb  12.8 oz (99.2 kg)  ?04/25/21 214 lb 6.4 oz (97.3 kg)  ?03/19/21 205 lb (93 kg)  ? ? ?Health Maintenance Due  ?Topic Date Due  ? Zoster Vaccines- Shingrix (2 of 2) 06/20/2021  ? ? ?There are no preventive care reminders to display for this patient. ? ? ?Lab Results  ?Component Value Date  ? TSH 1.82 04/25/2021  ? ?Lab Results  ?Component Value Date  ? WBC 7.1 04/25/2021  ? HGB 15.4 04/25/2021  ? HCT 44.5 04/25/2021  ? MCV 90.2 04/25/2021  ? PLT 202.0 04/25/2021  ? ?Lab Results  ?Component Value Date  ? NA 137 04/25/2021  ? K 3.9 04/25/2021  ? CO2 29 04/25/2021  ? GLUCOSE 84 04/25/2021  ? BUN 14 04/25/2021  ? CREATININE 0.92 04/25/2021  ? BILITOT 0.8 04/25/2021  ? ALKPHOS 80 04/25/2021  ? AST 14 04/25/2021  ? ALT 13 04/25/2021  ? PROT 7.2 04/25/2021  ? ALBUMIN 4.0 04/25/2021  ? CALCIUM 9.0 04/25/2021  ? ANIONGAP 10 03/19/2021  ? GFR 91.32 04/25/2021  ? ?Lab Results  ?Component Value Date  ? CHOL 117 04/25/2021  ? ?Lab Results  ?Component Value Date  ? HDL 44.90 04/25/2021  ? ?Lab Results  ?Component Value Date  ? Emmett 59 04/25/2021  ? ?Lab Results  ?Component Value Date  ? TRIG 69.0 04/25/2021  ? ?Lab Results  ?Component Value Date  ? CHOLHDL 3 04/25/2021  ? ?No results found for: HGBA1C ? ?   ?Assessment & Plan:  ? ?Problem List Items Addressed This Visit   ? ?  ? Other  ? Arthralgia of both hands - Primary  ?  Acute, ongoing for about 4 weeks.  He states that he has been having swelling and pain and stiffness in his fingers, along with pain in both shoulders.  He does have some pain in his left hip and his right knee as well.  He is following with orthopedics who gave him injections in his shoulders which did help with some of the pain, however the swelling in his hands came back.  His orthopedic did x-rays of his hands which came back normal per patient.  With symptoms worse in the morning and getting better through the day, and swelling, concern for rheumatoid  arthritis.  We will check ANA, rheumatoid factor,  ESR, CRP.  Start prednisone 20 mg daily for 5 days.  He can take Tylenol and use ice/heat as needed for pain.  We will follow-up based on lab results. ?  ?  ? Relevant Orders  ? ANA w/Reflex  ? Rheumatoid Factor  ? Sedimentation rate  ? C-reactive protein  ? ? ? ?Meds ordered this encounter  ?Medications  ? predniSONE (DELTASONE) 20 MG tablet  ?  Sig: Take 1 tablet (20 mg total) by mouth daily with breakfast.  ?  Dispense:  5 tablet  ?  Refill:  0  ? ? ? ?Charyl Dancer, NP ? ?

## 2021-06-29 ENCOUNTER — Ambulatory Visit: Payer: 59 | Admitting: Nurse Practitioner

## 2021-06-29 ENCOUNTER — Encounter: Payer: Self-pay | Admitting: Nurse Practitioner

## 2021-06-29 VITALS — BP 138/84 | HR 111 | Temp 97.5°F | Wt 218.8 lb

## 2021-06-29 DIAGNOSIS — R768 Other specified abnormal immunological findings in serum: Secondary | ICD-10-CM | POA: Diagnosis not present

## 2021-06-29 DIAGNOSIS — M25542 Pain in joints of left hand: Secondary | ICD-10-CM

## 2021-06-29 DIAGNOSIS — M25541 Pain in joints of right hand: Secondary | ICD-10-CM

## 2021-06-29 MED ORDER — PREDNISONE 20 MG PO TABS: 20.0000 mg | ORAL_TABLET | Freq: Every day | ORAL | 0 refills | Status: DC

## 2021-06-29 NOTE — Patient Instructions (Addendum)
It was great to see you! ? ?We are checking labs today and will let you know the results. Start prednisone 1 tablet daily in the mornings with food for 5 days.  ? ?Check your blood pressure at home a few times a week and write it down. Call if it is >140/90 for several readings in a row.  ? ?Let's follow-up based on the lab results.  ? ?If a referral was placed today, you will be contacted for an appointment. Please note that routine referrals can sometimes take up to 3-4 weeks to process. Please call our office if you haven't heard anything after this time frame. ? ?Take care, ? ?Vance Peper, NP ? ?

## 2021-06-29 NOTE — Assessment & Plan Note (Signed)
Acute, ongoing for about 4 weeks.  He states that he has been having swelling and pain and stiffness in his fingers, along with pain in both shoulders.  He does have some pain in his left hip and his right knee as well.  He is following with orthopedics who gave him injections in his shoulders which did help with some of the pain, however the swelling in his hands came back.  His orthopedic did x-rays of his hands which came back normal per patient.  With symptoms worse in the morning and getting better through the day, and swelling, concern for rheumatoid arthritis.  We will check ANA, rheumatoid factor, ESR, CRP.  Start prednisone 20 mg daily for 5 days.  He can take Tylenol and use ice/heat as needed for pain.  We will follow-up based on lab results. ?

## 2021-06-30 ENCOUNTER — Encounter: Payer: Self-pay | Admitting: Nurse Practitioner

## 2021-06-30 LAB — SEDIMENTATION RATE: Sed Rate: 30 mm/hr — ABNORMAL HIGH (ref 0–20)

## 2021-06-30 LAB — C-REACTIVE PROTEIN: CRP: 2.1 mg/dL (ref 0.5–20.0)

## 2021-06-30 LAB — RHEUMATOID FACTOR: Rheumatoid fact SerPl-aCnc: 52 IU/mL — ABNORMAL HIGH (ref <14)

## 2021-06-30 NOTE — Addendum Note (Signed)
Addended by: Vance Peper A on: 06/30/2021 08:01 AM ? ? Modules accepted: Orders ? ?

## 2021-07-01 ENCOUNTER — Ambulatory Visit: Payer: 59 | Admitting: Nurse Practitioner

## 2021-07-01 LAB — ANA W/REFLEX: Anti Nuclear Antibody (ANA): NEGATIVE

## 2021-07-04 MED ORDER — PREDNISONE 10 MG PO TABS: ORAL_TABLET | ORAL | 0 refills | Status: DC

## 2021-07-07 NOTE — Progress Notes (Signed)
? ?Office Visit Note ? ?Patient: Thomas Davidson             ?Date of Birth: 11/03/1960           ?MRN: [id number]             ?PCP: Charyl Dancer, NP ?Referring: Charyl Dancer, NP ?Visit Date: 07/08/2021 ?Occupation: '@GUAROCC'$ @ ? ?Subjective:  ?Pain and swelling in multiple joints ? ?History of Present Illness: [name] is a 61 y.o. male seen in consultation per request of his PCP.  According the patient about 4 months ago he started having pain in his bilateral shoulders.  He states initially he thought that the pain was related to his sleeping position.  The pain moved down into his biceps area.  He was evaluated by Dr. Percell Miller.  He states he was diagnosed with biceps tendinitis and was sent for physical therapy.  He went to physical therapy for about 4 weeks without improvement in his symptoms.  1 month later he started having pain and swelling in his bilateral hands.  At the time he was given a prescription for meloxicam without much relief.  He states the pain and swelling got worse over the last 1 month.  He complains of pain and swelling in his bilateral hands, discomfort in his both shoulders, bilateral hips and his knee joints.  He has not noticed any swelling in his knee joints.  He denies any discomfort in his ankles and his feet.  There is no history of fever or infection.  There is no history of Achilles tendinitis, Planter fasciitis, uveitis, shortness of breath.  He was seen by his PCP two weeks and the labs showed positive rheumatoid factor and elevated sedimentation rate.  He was given a prednisone taper which helped him with the arthritis.  As soon as he finished the taper the swelling came back.  He was started on another prednisone taper at 60 mg p.o. daily.  He is currently on prednisone 30 mg p.o. daily.  The symptoms have improved on prednisone.  He was referred to me for further evaluation and treatment.  There is no family history of autoimmune disease. ? ?Activities of Daily Living:   ?Patient reports morning stiffness for 1 hour.   ?Patient Denies nocturnal pain.  ?Difficulty dressing/grooming: Denies ?Difficulty climbing stairs: Denies ?Difficulty getting out of chair: Denies ?Difficulty using hands for taps, buttons, cutlery, and/or writing: Reports ? ?Review of Systems  ?Constitutional:  Positive for fatigue.  ?HENT:  Negative for mouth sores, mouth dryness and nose dryness.   ?Eyes:  Negative for pain, itching and dryness.  ?Respiratory:  Negative for shortness of breath and difficulty breathing.   ?Cardiovascular:  Negative for chest pain and palpitations.  ?Gastrointestinal:  Positive for constipation. Negative for blood in stool and diarrhea.  ?Endocrine: Negative for increased urination.  ?Genitourinary:  Negative for difficulty urinating.  ?Musculoskeletal:  Positive for joint pain, joint pain, joint swelling and morning stiffness. Negative for myalgias, muscle tenderness and myalgias.  ?Skin:  Positive for redness. Negative for color change and rash.  ?Allergic/Immunologic: Negative for susceptible to infections.  ?Neurological:  Negative for dizziness, numbness, headaches, memory loss and weakness.  ?Hematological:  Positive for bruising/bleeding tendency.  ?Psychiatric/Behavioral:  Negative for confusion.   ? ?PMFS History:  ?Patient Active Problem List  ? Diagnosis Date Noted  ? Arthralgia of both hands 06/29/2021  ? Pain in both upper arms 04/25/2021  ? Elevated blood pressure reading 04/25/2021  ?  History of BPH 06/12/2016  ? Neuroma 04/05/2015  ? Allergic rhinitis 11/19/2008  ?  ?Past Medical History:  ?Diagnosis Date  ? Allergy   ? Asthma   ? as child  ? BPH (benign prostatic hyperplasia)   ?  ?Family History  ?Problem Relation Age of Onset  ? Healthy Mother   ? Heart disease Father   ? Diabetes Maternal Grandfather   ? Healthy Son   ? Healthy Daughter   ? Healthy Daughter   ? Colon cancer Neg Hx   ? ?Past Surgical History:  ?Procedure Laterality Date  ? NO PAST SURGERIES     ? ?Social History  ? ?Social History Narrative  ?  Married, 3 children, 6 grandchildren. Works as a Librarian, academic at [employer].  ?   ? ?Immunization History  ?Administered Date(s) Administered  ? Influenza Split 02/05/2012  ? Influenza Whole 12/19/2006, 12/18/2008  ? Influenza,inj,Quad PF,6+ Mos 02/05/2013, 01/26/2014  ? Influenza-Unspecified 10/19/2014, 01/23/2021  ? Td 10/20/2002  ? Tdap 02/05/2013  ? Zoster Recombinat (Shingrix) 04/25/2021  ?  ? ?Objective: ?Vital Signs: BP (!) 146/85 (BP Location: Right Arm, Patient Position: Sitting, Cuff Size: Normal)   Pulse 89   Ht '6\' 1"'$  (1.854 m)   Wt 218 lb 9.6 oz (99.2 kg)   BMI 28.84 kg/m?   ? ?Physical Exam ?Vitals and nursing note reviewed.  ?Constitutional:   ?   Appearance: He is well-developed.  ?HENT:  ?   Head: Normocephalic and atraumatic.  ?Eyes:  ?   Conjunctiva/sclera: Conjunctivae normal.  ?   Pupils: Pupils are equal, round, and reactive to light.  ?Cardiovascular:  ?   Rate and Rhythm: Normal rate and regular rhythm.  ?   Heart sounds: Normal heart sounds.  ?Pulmonary:  ?   Effort: Pulmonary effort is normal.  ?   Breath sounds: Normal breath sounds.  ?Abdominal:  ?   General: Bowel sounds are normal.  ?   Palpations: Abdomen is soft.  ?Musculoskeletal:  ?   Cervical back: Normal range of motion and neck supple.  ?Skin: ?   General: Skin is warm and dry.  ?   Capillary Refill: Capillary refill takes less than 2 seconds.  ?Neurological:  ?   Mental Status: He is alert and oriented to person, place, and time.  ?Psychiatric:     ?   Behavior: Behavior normal.  ?  ? ?Musculoskeletal Exam: C-spine was in good range of motion.  He had some discomfort range of motion of the shoulder joints.  There was no tenderness on effusion over shoulders.  Elbow joints with good range of motion.  He had painful range of motion of bilateral wrist joints.  He had synovitis over some of the MCPs and PIPs as described below.  He had incomplete fist formation due to swelling in  the index finger.  Hip joints with good range of motion.  He is good range of motion of his knee joints with some discomfort.  There was no tenderness over ankles or MTPs. ? ?CDAI Exam: ?CDAI Score: 14.4  ?Patient Global: 2 mm; Provider Global: 2 mm ?Swollen: 3 ; Tender: 11  ?Joint Exam 07/08/2021  ? ?   Right  Left  ?Glenohumeral   Tender   Tender  ?Wrist   Tender   Tender  ?MCP 2   Tender   Tender  ?MCP 3   Tender   Tender  ?PIP 2  Swollen Tender  Swollen Tender  ?PIP 3  Swollen Tender  ? ? ? ?Investigation: ?No additional findings. ? ?Imaging: ?No results found. ? ?Recent Labs: ?Lab Results  ?Component Value Date  ? WBC 7.1 04/25/2021  ? HGB 15.4 04/25/2021  ? PLT 202.0 04/25/2021  ? NA 137 04/25/2021  ? K 3.9 04/25/2021  ? CL 102 04/25/2021  ? CO2 29 04/25/2021  ? GLUCOSE 84 04/25/2021  ? BUN 14 04/25/2021  ? CREATININE 0.92 04/25/2021  ? BILITOT 0.8 04/25/2021  ? ALKPHOS 80 04/25/2021  ? AST 14 04/25/2021  ? ALT 13 04/25/2021  ? PROT 7.2 04/25/2021  ? ALBUMIN 4.0 04/25/2021  ? CALCIUM 9.0 04/25/2021  ? GFRAA 104 01/03/2007  ? ? ?Speciality Comments: No specialty comments available. ? ?Procedures:  ?No procedures performed ?Allergies: Bee venom  ? ?Assessment / Plan:     ?Visit Diagnoses: Rheumatoid arthritis involving multiple sites with positive rheumatoid factor ([place]) -his symptoms started in January 2023 with pain in bilateral shoulders.  The pain gradually spread to his other joints.  He has been having severe pain and swelling in multiple joints over the last couple of months.  He was initially diagnosed with bicipital tendinitis and had physical therapy for 4 weeks without any response.  He had been having severe pain and swelling in his bilateral hands.  He was given a prednisone taper by his PCP about 2 weeks ago which helped but symptoms recurred after the prednisone taper was over.  He was given another prednisone taper starting at 60 mg p.o. daily.  He is currently on prednisone 30 mg p.o. daily.   He states he is unable to function without prednisone.  Synovitis was noted in multiple joints as described about today.  He has positive rheumatoid factor and elevated sedimentation rate.  06/29/21: ANA negat

## 2021-07-08 ENCOUNTER — Ambulatory Visit (INDEPENDENT_AMBULATORY_CARE_PROVIDER_SITE_OTHER): Payer: 59

## 2021-07-08 ENCOUNTER — Ambulatory Visit (HOSPITAL_COMMUNITY)
Admission: RE | Admit: 2021-07-08 | Discharge: 2021-07-08 | Disposition: A | Discharge status: Home / Self Care | Payer: 59 | Source: Ambulatory Visit | Attending: Rheumatology | Admitting: Rheumatology

## 2021-07-08 ENCOUNTER — Encounter: Payer: Self-pay | Admitting: Rheumatology

## 2021-07-08 ENCOUNTER — Other Ambulatory Visit: Payer: Self-pay

## 2021-07-08 ENCOUNTER — Ambulatory Visit: Payer: 59 | Admitting: Rheumatology

## 2021-07-08 VITALS — BP 146/85 | HR 89 | Ht 73.0 in | Wt 218.6 lb

## 2021-07-08 DIAGNOSIS — M79642 Pain in left hand: Secondary | ICD-10-CM | POA: Diagnosis not present

## 2021-07-08 DIAGNOSIS — M0579 Rheumatoid arthritis with rheumatoid factor of multiple sites without organ or systems involvement: Secondary | ICD-10-CM

## 2021-07-08 DIAGNOSIS — M25562 Pain in left knee: Secondary | ICD-10-CM

## 2021-07-08 DIAGNOSIS — M25561 Pain in right knee: Secondary | ICD-10-CM

## 2021-07-08 DIAGNOSIS — Z79899 Other long term (current) drug therapy: Secondary | ICD-10-CM

## 2021-07-08 DIAGNOSIS — Z87438 Personal history of other diseases of male genital organs: Secondary | ICD-10-CM

## 2021-07-08 DIAGNOSIS — J301 Allergic rhinitis due to pollen: Secondary | ICD-10-CM

## 2021-07-08 DIAGNOSIS — M79672 Pain in left foot: Secondary | ICD-10-CM

## 2021-07-08 DIAGNOSIS — D3610 Benign neoplasm of peripheral nerves and autonomic nervous system, unspecified: Secondary | ICD-10-CM

## 2021-07-08 DIAGNOSIS — G8929 Other chronic pain: Secondary | ICD-10-CM

## 2021-07-08 DIAGNOSIS — M79641 Pain in right hand: Secondary | ICD-10-CM

## 2021-07-08 DIAGNOSIS — R5383 Other fatigue: Secondary | ICD-10-CM

## 2021-07-08 DIAGNOSIS — R768 Other specified abnormal immunological findings in serum: Secondary | ICD-10-CM

## 2021-07-08 DIAGNOSIS — M79671 Pain in right foot: Secondary | ICD-10-CM | POA: Diagnosis not present

## 2021-07-08 MED ORDER — PREDNISONE 5 MG PO TABS: ORAL_TABLET | ORAL | 0 refills | Status: DC

## 2021-07-08 NOTE — Patient Instructions (Signed)
Start prednisone 20 mg p.o. daily for 1 week and then decrease by 5 mg every week ? ?Methotrexate Tablets ?What is this medication? ?METHOTREXATE (METH oh TREX ate) treats inflammatory conditions such as arthritis and psoriasis. It works by decreasing inflammation, which can reduce pain and prevent long-term injury to the joints and skin. It may also be used to treat some types of cancer. It works by slowing down the growth of cancer cells. ?This medicine may be used for other purposes; ask your health care provider or pharmacist if you have questions. ?COMMON BRAND NAME(S): Rheumatrex, Trexall ?What should I tell my care team before I take this medication? ?They need to know if you have any of these conditions: ?Fluid in the stomach area or lungs ?If you often drink alcohol ?Infection or immune system problems ?Kidney disease or on hemodialysis ?Liver disease ?Low blood counts, like low white cell, platelet, or red cell counts ?Lung disease ?Radiation therapy ?Stomach ulcers ?Ulcerative colitis ?An unusual or allergic reaction to methotrexate, other medications, foods, dyes, or preservatives ?Pregnant or trying to get pregnant ?Breast-feeding ?How should I use this medication? ?Take this medication by mouth with a glass of water. Follow the directions on the prescription label. Take your medication at regular intervals. Do not take it more often than directed. Do not stop taking except on your care team's advice. ?Make sure you know why you are taking this medication and how often you should take it. If this medication is used for a condition that is not cancer, like arthritis or psoriasis, it should be taken weekly, NOT daily. Taking this medication more often than directed can cause serious side effects, even death. ?Talk to your care team about safe handling and disposal of this medication. You may need to take special precautions. ?Talk to your care team about the use of this medication in children. While this  medication may be prescribed for selected conditions, precautions do apply. ?Overdosage: If you think you have taken too much of this medicine contact a poison control center or emergency room at once. ?NOTE: This medicine is only for you. Do not share this medicine with others. ?What if I miss a dose? ?If you miss a dose, talk with your care team. Do not take double or extra doses. ?What may interact with this medication? ?Do not take this medication with any of the following: ?Acitretin ?This medication may also interact with the following: ?Aspirin and aspirin-like medications including salicylates ?Azathioprine ?Certain antibiotics like penicillins, tetracycline, and chloramphenicol ?Certain medications that treat or prevent blood clots like warfarin, apixaban, dabigatran, and rivaroxaban ?Certain medications for stomach problems like esomeprazole, omeprazole, pantoprazole ?Cyclosporine ?Dapsone ?Diuretics ?Gold ?Hydroxychloroquine ?Live virus vaccines ?Medications for infection like acyclovir, adefovir, amphotericin B, bacitracin, cidofovir, foscarnet, ganciclovir, gentamicin, pentamidine, vancomycin ?Mercaptopurine ?NSAIDs, medications for pain and inflammation, like ibuprofen or naproxen ?Other cytotoxic agents ?Pamidronate ?Pemetrexed ?Penicillamine ?Phenylbutazone ?Phenytoin ?Probenecid ?Pyrimethamine ?Retinoids such as isotretinoin and tretinoin ?Steroid medications like prednisone or cortisone ?Sulfonamides like sulfasalazine and trimethoprim/sulfamethoxazole ?Theophylline ?Zoledronic acid ?This list may not describe all possible interactions. Give your health care provider a list of all the medicines, herbs, non-prescription drugs, or dietary supplements you use. Also tell them if you smoke, drink alcohol, or use illegal drugs. Some items may interact with your medicine. ?What should I watch for while using this medication? ?Avoid alcoholic drinks. ?This medication can make you more sensitive to the sun.  Keep out of the sun. If you cannot avoid being in the  sun, wear protective clothing and use sunscreen. Do not use sun lamps or tanning beds/booths. ?You may need blood work done while you are taking this medication. ?Call your care team for advice if you get a fever, chills or sore throat, or other symptoms of a cold or flu. Do not treat yourself. This medication decreases your body's ability to fight infections. Try to avoid being around people who are sick. ?This medication may increase your risk to bruise or bleed. Call your care team if you notice any unusual bleeding. ?Be careful brushing or flossing your teeth or using a toothpick because you may get an infection or bleed more easily. If you have any dental work done, tell your dentist you are receiving this medication. ?Check with your care team if you get an attack of severe diarrhea, nausea and vomiting, or if you sweat a lot. The loss of too much body fluid can make it dangerous for you to take this medication. ?Talk to your care team about your risk of cancer. You may be more at risk for certain types of cancers if you take this medication. ?Do not become pregnant while taking this medication or for 6 months after stopping it. Women should inform their care team if they wish to become pregnant or think they might be pregnant. Men should not father a child while taking this medication and for 3 months after stopping it. There is potential for serious harm to an unborn child. Talk to your care team for more information. Do not breast-feed an infant while taking this medication or for 1 week after stopping it. ?This medication may make it more difficult to get pregnant or father a child. Talk to your care team if you are concerned about your fertility. ?What side effects may I notice from receiving this medication? ?Side effects that you should report to your care team as soon as possible: ?Allergic reactions--skin rash, itching, hives, swelling of the face,  lips, tongue, or throat ?Blood clot--pain, swelling, or warmth in the leg, shortness of breath, chest pain ?Dry cough, shortness of breath or trouble breathing ?Infection--fever, chills, cough, sore throat, wounds that don't heal, pain or trouble when passing urine, general feeling of discomfort or being unwell ?Kidney injury--decrease in the amount of urine, swelling of the ankles, hands, or feet ?Liver injury--right upper belly pain, loss of appetite, nausea, light-colored stool, dark yellow or brown urine, yellowing of the skin or eyes, unusual weakness or fatigue ?Low red blood cell count--unusual weakness or fatigue, dizziness, headache, trouble breathing ?Redness, blistering, peeling, or loosening of the skin, including inside the mouth ?Seizures ?Unusual bruising or bleeding ?Side effects that usually do not require medical attention (report to your care team if they continue or are bothersome): ?Diarrhea ?Dizziness ?Hair loss ?Nausea ?Pain, redness, or swelling with sores inside the mouth or throat ?Vomiting ?This list may not describe all possible side effects. Call your doctor for medical advice about side effects. You may report side effects to FDA at 1-800-FDA-1088. ?Where should I keep my medication? ?Keep out of the reach of children and pets. ?Store at room temperature between 20 and 25 degrees C (68 and 77 degrees F). Protect from light. Get rid of any unused medication after the expiration date. ?Talk to your care team about how to dispose of unused medication. Special directions may apply. ?NOTE: This sheet is a summary. It may not cover all possible information. If you have questions about this medicine, talk to your doctor,  pharmacist, or health care provider. ?? 2023 Elsevier/Gold Standard (2020-05-10 00:00:00) ? ?Standing Labs ?We placed an order today for your standing lab work.  ? ?Please have your standing labs drawn in 2 weeks x 2 and then every 3 months ? ?If possible, please have your  labs drawn 2 weeks prior to your appointment so that the provider can discuss your results at your appointment. ? ?Please note that you may see your imaging and lab results in [facility] before we have re

## 2021-07-08 NOTE — Telephone Encounter (Signed)
Pending lab results and chest x-ray, patient will be starting methotrexate and folic acid per Dr. Estanislado Pandy. Thanks!  ? ?Consent obtained and sent to the scan center.  ?

## 2021-07-10 NOTE — Progress Notes (Signed)
Chest x-ray is unremarkable per radiology report.

## 2021-07-12 LAB — IGG, IGA, IGM
IgG (Immunoglobin G), Serum: 1116 mg/dL (ref 600–1640)
IgM, Serum: 161 mg/dL (ref 50–300)
Immunoglobulin A: 488 mg/dL — ABNORMAL HIGH (ref 47–310)

## 2021-07-12 LAB — HIV ANTIBODY (ROUTINE TESTING W REFLEX): HIV 1&2 Ab, 4th Generation: NONREACTIVE

## 2021-07-12 LAB — QUANTIFERON-TB GOLD PLUS
Mitogen-NIL: 0.98 IU/mL
NIL: 0.04 IU/mL
QuantiFERON-TB Gold Plus: NEGATIVE
TB1-NIL: <0 IU/mL
TB2-NIL: 0 IU/mL

## 2021-07-12 LAB — HEPATITIS C ANTIBODY
Hepatitis C Ab: NONREACTIVE
SIGNAL TO CUT-OFF: 0.07 (ref <1.00)

## 2021-07-12 LAB — TSH: TSH: 1.15 mIU/L (ref 0.40–4.50)

## 2021-07-12 LAB — CK: Total CK: 39 U/L — ABNORMAL LOW (ref 44–196)

## 2021-07-12 LAB — HEPATITIS B SURFACE ANTIGEN: Hepatitis B Surface Ag: NONREACTIVE

## 2021-07-12 LAB — PROTEIN ELECTROPHORESIS, SERUM, WITH REFLEX
Albumin ELP: 3.4 g/dL — ABNORMAL LOW (ref 3.8–4.8)
Alpha 1: 0.3 g/dL (ref 0.2–0.3)
Alpha 2: 0.7 g/dL (ref 0.5–0.9)
Beta 2: 0.5 g/dL (ref 0.2–0.5)
Beta Globulin: 0.5 g/dL (ref 0.4–0.6)
Gamma Globulin: 1.1 g/dL (ref 0.8–1.7)
Total Protein: 6.5 g/dL (ref 6.1–8.1)

## 2021-07-12 LAB — HEPATITIS B CORE ANTIBODY, IGM: Hep B C IgM: NONREACTIVE

## 2021-07-12 LAB — HEPATITIS B DNA, ULTRAQUANTITATIVE, PCR
Hepatitis B DNA (Calc): <1 Log IU/mL
Hepatitis B DNA: <10 IU/mL

## 2021-07-12 LAB — CYCLIC CITRUL PEPTIDE ANTIBODY, IGG: Cyclic Citrullin Peptide Ab: >250 UNITS — ABNORMAL HIGH

## 2021-07-12 LAB — GLUCOSE 6 PHOSPHATE DEHYDROGENASE: G-6PDH: 17.8 U/g Hgb (ref 7.0–20.5)

## 2021-07-13 NOTE — Progress Notes (Signed)
Please notify patient that the labs are consistent with rheumatoid arthritis.  I will discuss the results in detail at the follow-up visit.  Please send a prescription for methotrexate 6 tablets p.o. weekly along with folic acid 2 mg p.o. daily.  The dose of methotrexate can be increased to 8 tablets p.o. weekly if the labs are normal in 2 weeks.  He should get labs 2 weeks x 2 and then every 3 months.

## 2021-07-14 ENCOUNTER — Telehealth: Payer: Self-pay | Admitting: Rheumatology

## 2021-07-14 NOTE — Telephone Encounter (Signed)
Attempted to contact the patient and left message for patient to call the office.  

## 2021-07-14 NOTE — Telephone Encounter (Signed)
Patient called the office stating he had missed a call from Dr. Estanislado Pandy regarding his labs and requests a call back.  ?

## 2021-07-15 MED ORDER — FOLIC ACID 1 MG PO TABS: 2.0000 mg | ORAL_TABLET | Freq: Every day | ORAL | 3 refills | Status: DC

## 2021-07-15 MED ORDER — METHOTREXATE 2.5 MG PO TABS: ORAL_TABLET | ORAL | 0 refills | Status: DC

## 2021-07-27 NOTE — Progress Notes (Signed)
Office Visit Note  Patient: Thomas Davidson             Date of Birth: 01/21/1961           MRN: [id number]             PCP: Charyl Dancer, NP Referring: Charyl Dancer, NP Visit Date: 08/09/2021 Occupation: '@GUAROCC'$ @  Subjective:  Rheumatoid Arthritis (Fatigue, bil hand and wrist pain, left shoulder pain)   History of Present Illness: Thomas Davidson is a 61 y.o. male with history of seropositive rheumatoid arthritis.  He was on a prednisone taper which he finished yesterday.  He has been on methotrexate for last 4 weeks now.  He took  methotrexate 6 tablets p.o. weekly for couple of weeks and now methotrexate 8 tablets p.o. weekly weekly for the last 2 weeks.  He has been experiencing fatigue after methotrexate dose.  He states the fatigue is lasting for couple of days.  He denies any other side effects from methotrexate.  He continues to have discomfort in his left shoulder, bilateral wrist joints and both hands.  He notices some swelling in his hands.  None of the other joints are painful today.  Activities of Daily Living:  Patient reports morning stiffness for 2 hours.   Patient Denies nocturnal pain.  Difficulty dressing/grooming: Denies Difficulty climbing stairs: Denies Difficulty getting out of chair: Denies Difficulty using hands for taps, buttons, cutlery, and/or writing: Denies  Review of Systems  Constitutional:  Positive for fatigue.  HENT:  Negative for mouth dryness.   Eyes:  Negative for dryness.  Respiratory:  Negative for shortness of breath.   Cardiovascular:  Negative for swelling in legs/feet.  Gastrointestinal:  Negative for constipation.  Endocrine: Negative for excessive thirst.  Genitourinary:  Negative for difficulty urinating.  Musculoskeletal:  Positive for joint pain, joint pain, joint swelling, muscle weakness and morning stiffness.  Skin:  Negative for rash.  Allergic/Immunologic: Negative for susceptible to infections.  Neurological:  Positive for  weakness.  Hematological:  Negative for bruising/bleeding tendency.  Psychiatric/Behavioral:  Negative for sleep disturbance.    PMFS History:  Patient Active Problem List   Diagnosis Date Noted   Arthralgia of both hands 06/29/2021   Pain in both upper arms 04/25/2021   Elevated blood pressure reading 04/25/2021   History of BPH 06/12/2016   Neuroma 04/05/2015   Allergic rhinitis 11/19/2008    Past Medical History:  Diagnosis Date   Allergy    Asthma    as child   BPH (benign prostatic hyperplasia)    Rheumatoid arthritis ([place])     Family History  Problem Relation Age of Onset   Healthy Mother    Heart disease Father    Diabetes Maternal Grandfather    Healthy Son    Healthy Daughter    Healthy Daughter    Colon cancer Neg Hx    Past Surgical History:  Procedure Laterality Date   NO PAST SURGERIES     Social History   Social History Narrative    Married, 3 children, 6 grandchildren. Works as a Librarian, academic at [employer].      Immunization History  Administered Date(s) Administered   Influenza Split 02/05/2012   Influenza Whole 12/19/2006, 12/18/2008   Influenza,inj,Quad PF,6+ Mos 02/05/2013, 01/26/2014   Influenza-Unspecified 10/19/2014, 01/23/2021   Td 10/20/2002   Tdap 02/05/2013   Zoster Recombinat (Shingrix) 04/25/2021     Objective: Vital Signs: BP 127/78 (BP Location: Left Arm, Patient Position:  Sitting, Cuff Size: Normal)   Pulse (!) 108   Resp 15   Ht '6\' 1"'$  (1.854 m)   Wt 218 lb (98.9 kg)   BMI 28.76 kg/m    Physical Exam Vitals and nursing note reviewed.  Constitutional:      Appearance: He is well-developed.  HENT:     Head: Normocephalic and atraumatic.  Eyes:     Conjunctiva/sclera: Conjunctivae normal.     Pupils: Pupils are equal, round, and reactive to light.  Cardiovascular:     Rate and Rhythm: Normal rate and regular rhythm.     Heart sounds: Normal heart sounds.  Pulmonary:     Effort: Pulmonary effort is normal.      Breath sounds: Normal breath sounds.  Abdominal:     General: Bowel sounds are normal.     Palpations: Abdomen is soft.  Musculoskeletal:     Cervical back: Normal range of motion and neck supple.  Skin:    General: Skin is warm and dry.     Capillary Refill: Capillary refill takes less than 2 seconds.  Neurological:     Mental Status: He is alert and oriented to person, place, and time.  Psychiatric:        Behavior: Behavior normal.     Musculoskeletal Exam: C-spine was in good range of motion.  He has some discomfort range of motion of his left shoulder joint.  Elbow joints, wrist joints, MCPs PIPs and DIPs with good range of motion.  He had tenderness on palpation over left second PIP joint.  Hip joints and knee joints in good range of motion without any warmth swelling or effusion.  There was no tenderness over ankles or MTPs.  CDAI Exam: CDAI Score: 2.8  Patient Global: 4 mm; Provider Global: 4 mm Swollen: 0 ; Tender: 2  Joint Exam 08/09/2021      Right  Left  Glenohumeral      Tender  PIP 2   Tender        Investigation: No additional findings.  Imaging: No results found.  Recent Labs: Lab Results  Component Value Date   WBC 10.4 07/29/2021   HGB 15.2 07/29/2021   PLT 245 07/29/2021   NA 139 07/29/2021   K 4.3 07/29/2021   CL 105 07/29/2021   CO2 26 07/29/2021   GLUCOSE 92 07/29/2021   BUN 13 07/29/2021   CREATININE 1.12 07/29/2021   BILITOT 0.6 07/29/2021   ALKPHOS 80 04/25/2021   AST 12 07/29/2021   ALT 15 07/29/2021   PROT 6.8 07/29/2021   ALBUMIN 4.0 04/25/2021   CALCIUM 9.1 07/29/2021   GFRAA 104 01/03/2007   QFTBGOLDPLUS NEGATIVE 07/08/2021   July 08, 2021 SPEP normal, IgA 488, TB Gold negative, hepatitis B-, hepatitis C negative, HIV negative, G6PD normal, CK 39, TSH normal, anti-CCP> 250  Speciality Comments: MTX 06/2021  Procedures:  No procedures performed Allergies: Bee venom   Assessment / Plan:     Visit Diagnoses: Rheumatoid  arthritis involving multiple sites with positive rheumatoid factor (HCC) - +RF, +CCP Ab, ANA-, synovitis noted in multiple joints.  He was given a prednisone taper in April 2023.  Methotrexate 6 tablets p.o. q. weekly started 07/15/21.  He is currently on methotrexate 8 tablets p.o. weekly along with folic acid 2 mg p.o. daily.  He finished prednisone taper yesterday.  He continues to have some discomfort in his joints especially in his left shoulder, wrist joints in his hands.  No synovitis was  noted.  He had tenderness over some of the joints as described above.  He has been experiencing fatigue on methotrexate lasting for 2 days.  We had a detailed discussion regarding other treatment options including subcutaneous methotrexate which most likely will not improve his fatigue.  Or switching to leflunomide or one of the biologic DMARD.  Indications side effects contraindications were discussed at length.  At this point patient would like to stay on oral methotrexate.  I advised her to contact us in case if his symptoms gets worse or he had more side effects from methotrexate.  High risk medication use - Methotrexate 2.5 mg tablet, 8 tablets p.o. weekly, folic acid 1 mg tablet, 2 tablets p.o. daily -Labs from Jul 29, 2021 were reviewed which were within normal limits.  We will get labs today and every 3 months to monitor for drug toxicity.  Patient has updated information regarding the immunization.  He was advised to hold methotrexate in case he develops an infection and resume after the infection resolves.  Plan: CBC with Differential/Platelet, COMPLETE METABOLIC PANEL WITH GFR  Primary osteoarthritis of both hands - Synovitis was noted at the last visit.  He was on prednisone 30 mg p.o. daily.  He had no synovitis on examination today.  He finished prednisone taper yesterday.  He has some tenderness over the right second PIP joint.  X-rays showed osteoarthritic changes.  Labs and x-ray findings were reviewed  with the patient.  Chronic pain of both knees - History of pain in bilateral knee joints.  He states the knee joint discomfort resolved after being on prednisone and methotrexate.  X-rays were unremarkable.  X-ray findings were discussed with the patient.  Primary osteoarthritis of both feet - History of discomfort in bilateral feet.  X-rays obtained at the last visit showed early osteoarthritic changes.  X-ray findings were discussed with the patient.  Neuroma  Seasonal allergic rhinitis due to pollen  History of BPH  Orders: Orders Placed This Encounter  Procedures   CBC with Differential/Platelet   COMPLETE METABOLIC PANEL WITH GFR   No orders of the defined types were placed in this encounter.    Follow-Up Instructions: Return in about 3 months (around 11/09/2021) for Rheumatoid arthritis.   Bo Merino, MD  Note - This record has been created using Editor, commissioning.  Chart creation errors have been sought, but may not always  have been located. Such creation errors do not reflect on  the standard of medical care.

## 2021-07-28 ENCOUNTER — Other Ambulatory Visit: Payer: Self-pay | Admitting: Rheumatology

## 2021-07-29 ENCOUNTER — Other Ambulatory Visit: Payer: Self-pay | Admitting: *Deleted

## 2021-07-29 DIAGNOSIS — Z79899 Other long term (current) drug therapy: Secondary | ICD-10-CM

## 2021-07-30 LAB — COMPLETE METABOLIC PANEL WITH GFR
AG Ratio: 1.5 (calc) (ref 1.0–2.5)
ALT: 15 U/L (ref 9–46)
AST: 12 U/L (ref 10–35)
Albumin: 4.1 g/dL (ref 3.6–5.1)
Alkaline phosphatase (APISO): 63 U/L (ref 35–144)
BUN: 13 mg/dL (ref 7–25)
CO2: 26 mmol/L (ref 20–32)
Calcium: 9.1 mg/dL (ref 8.6–10.3)
Chloride: 105 mmol/L (ref 98–110)
Creat: 1.12 mg/dL (ref 0.70–1.30)
Globulin: 2.7 g/dL (calc) (ref 1.9–3.7)
Glucose, Bld: 92 mg/dL (ref 65–99)
Potassium: 4.3 mmol/L (ref 3.5–5.3)
Sodium: 139 mmol/L (ref 135–146)
Total Bilirubin: 0.6 mg/dL (ref 0.2–1.2)
Total Protein: 6.8 g/dL (ref 6.1–8.1)
eGFR: 76 mL/min/{1.73_m2} (ref >=60)

## 2021-07-30 LAB — CBC WITH DIFFERENTIAL/PLATELET
Absolute Monocytes: 645 cells/uL (ref 200–950)
Basophils Absolute: 42 cells/uL (ref 0–200)
Basophils Relative: 0.4 %
Eosinophils Absolute: 125 cells/uL (ref 15–500)
Eosinophils Relative: 1.2 %
HCT: 43.2 % (ref 38.5–50.0)
Hemoglobin: 15.2 g/dL (ref 13.2–17.1)
Lymphs Abs: 1290 cells/uL (ref 850–3900)
MCH: 31.6 pg (ref 27.0–33.0)
MCHC: 35.2 g/dL (ref 32.0–36.0)
MCV: 89.8 fL (ref 80.0–100.0)
MPV: 9.8 fL (ref 7.5–12.5)
Monocytes Relative: 6.2 %
Neutro Abs: 8299 cells/uL — ABNORMAL HIGH (ref 1500–7800)
Neutrophils Relative %: 79.8 %
Platelets: 245 10*3/uL (ref 140–400)
RBC: 4.81 10*6/uL (ref 4.20–5.80)
RDW: 13.3 % (ref 11.0–15.0)
Total Lymphocyte: 12.4 %
WBC: 10.4 10*3/uL (ref 3.8–10.8)

## 2021-07-31 NOTE — Progress Notes (Signed)
CBC and CMP are within normal limits.

## 2021-08-06 ENCOUNTER — Other Ambulatory Visit: Payer: Self-pay | Admitting: Rheumatology

## 2021-08-08 NOTE — Telephone Encounter (Signed)
Patient advised refill of methotrexate sent to the pharmacy. Advise patient Dr. Estanislado Pandy will determine tomorrow if he requires another course of prednisone. Patient expressed understanding.

## 2021-08-08 NOTE — Telephone Encounter (Signed)
Next Visit: 08/09/2021  Last Visit: 07/08/2021  Last Fill: 07/15/2021  DX: Rheumatoid arthritis involving multiple sites with positive rheumatoid factor   Current Dose per office note 07/08/2021: methotrexate 6 tablets p.o. weekly along with folic acid 2 mg p.o. daily after labs and x-rays are available.  We will increase the dose of methotrexate to 8 tablets p.o. weekly after 2 weeks if the labs are stable.   Patient states he has increased to 8 tablets of MTX weekly.   Labs: 07/29/2021 CBC and CMP are within normal limits.  Okay to refill MTX?   Patient also states he took his last dose of Prednisone today. Patient is asking for a refill. Patient received a Prednisone taper at his last office visit and is due to follow up tomorrow. Do you want to wait and discuss in office tomorrow?

## 2021-08-08 NOTE — Telephone Encounter (Signed)
Ok to refill methotrexate.   Dr. Estanislado Pandy will determine tomorrow if he requires another course of prednisone.

## 2021-08-09 ENCOUNTER — Encounter: Payer: Self-pay | Admitting: Rheumatology

## 2021-08-09 ENCOUNTER — Ambulatory Visit: Payer: 59 | Admitting: Rheumatology

## 2021-08-09 VITALS — BP 127/78 | HR 108 | Resp 15 | Ht 73.0 in | Wt 218.0 lb

## 2021-08-09 DIAGNOSIS — J301 Allergic rhinitis due to pollen: Secondary | ICD-10-CM

## 2021-08-09 DIAGNOSIS — Z79899 Other long term (current) drug therapy: Secondary | ICD-10-CM

## 2021-08-09 DIAGNOSIS — M0579 Rheumatoid arthritis with rheumatoid factor of multiple sites without organ or systems involvement: Secondary | ICD-10-CM | POA: Diagnosis not present

## 2021-08-09 DIAGNOSIS — M19071 Primary osteoarthritis, right ankle and foot: Secondary | ICD-10-CM

## 2021-08-09 DIAGNOSIS — M19041 Primary osteoarthritis, right hand: Secondary | ICD-10-CM

## 2021-08-09 DIAGNOSIS — M25561 Pain in right knee: Secondary | ICD-10-CM

## 2021-08-09 DIAGNOSIS — D3610 Benign neoplasm of peripheral nerves and autonomic nervous system, unspecified: Secondary | ICD-10-CM

## 2021-08-09 DIAGNOSIS — Z87438 Personal history of other diseases of male genital organs: Secondary | ICD-10-CM

## 2021-08-09 DIAGNOSIS — M19042 Primary osteoarthritis, left hand: Secondary | ICD-10-CM

## 2021-08-09 DIAGNOSIS — M25562 Pain in left knee: Secondary | ICD-10-CM

## 2021-08-09 DIAGNOSIS — M19072 Primary osteoarthritis, left ankle and foot: Secondary | ICD-10-CM

## 2021-08-09 DIAGNOSIS — G8929 Other chronic pain: Secondary | ICD-10-CM

## 2021-08-09 NOTE — Patient Instructions (Signed)
Standing Labs ?We placed an order today for your standing lab work.  ? ?Please have your standing labs drawn in August and every 3 months  ? ?If possible, please have your labs drawn 2 weeks prior to your appointment so that the provider can discuss your results at your appointment. ? ?Please note that you may see your imaging and lab results in MyChart before we have reviewed them. ?We may be awaiting multiple results to interpret others before contacting you. ?Please allow our office up to 72 hours to thoroughly review all of the results before contacting the office for clarification of your results. ? ?We have open lab daily: ?Monday through Thursday from 1:30-4:30 PM and Friday from 1:30-4:00 PM ?at the office of Dr. Sami Roes, [facility].   ?Please be advised, all patients with office appointments requiring lab work will take precedent over walk-in lab work.  ?If possible, please come for your lab work on Monday and Friday afternoons, as you may experience shorter wait times. ?The office is located at [address], [address], [place], [place] [address] ?No appointment is necessary.   ?Labs are drawn by [facility]. Please bring your co-pay at the time of your lab draw.  You may receive a bill from [facility] for your lab work. ? ?Please note if you are on Hydroxychloroquine and and an order has been placed for a Hydroxychloroquine level, you will need to have it drawn 4 hours or more after your last dose. ? ?If you wish to have your labs drawn at another location, please call the office 24 hours in advance to send orders. ? ?If you have any questions regarding directions or hours of operation,  ?please call [phone].   ?As a reminder, please drink plenty of water prior to coming for your lab work. Thanks! ? ?

## 2021-08-10 LAB — CBC WITH DIFFERENTIAL/PLATELET
Absolute Monocytes: 703 cells/uL (ref 200–950)
Basophils Absolute: 48 cells/uL (ref 0–200)
Basophils Relative: 0.5 %
Eosinophils Absolute: 741 cells/uL — ABNORMAL HIGH (ref 15–500)
Eosinophils Relative: 7.8 %
HCT: 42.6 % (ref 38.5–50.0)
Hemoglobin: 15.1 g/dL (ref 13.2–17.1)
Lymphs Abs: 1653 cells/uL (ref 850–3900)
MCH: 32.3 pg (ref 27.0–33.0)
MCHC: 35.4 g/dL (ref 32.0–36.0)
MCV: 91 fL (ref 80.0–100.0)
MPV: 10 fL (ref 7.5–12.5)
Monocytes Relative: 7.4 %
Neutro Abs: 6356 cells/uL (ref 1500–7800)
Neutrophils Relative %: 66.9 %
Platelets: 244 10*3/uL (ref 140–400)
RBC: 4.68 10*6/uL (ref 4.20–5.80)
RDW: 13.9 % (ref 11.0–15.0)
Total Lymphocyte: 17.4 %
WBC: 9.5 10*3/uL (ref 3.8–10.8)

## 2021-08-10 LAB — COMPLETE METABOLIC PANEL WITH GFR
AG Ratio: 1.4 (calc) (ref 1.0–2.5)
ALT: 13 U/L (ref 9–46)
AST: 11 U/L (ref 10–35)
Albumin: 3.9 g/dL (ref 3.6–5.1)
Alkaline phosphatase (APISO): 61 U/L (ref 35–144)
BUN: 12 mg/dL (ref 7–25)
CO2: 28 mmol/L (ref 20–32)
Calcium: 9.2 mg/dL (ref 8.6–10.3)
Chloride: 104 mmol/L (ref 98–110)
Creat: 0.92 mg/dL (ref 0.70–1.30)
Globulin: 2.7 g/dL (calc) (ref 1.9–3.7)
Glucose, Bld: 86 mg/dL (ref 65–99)
Potassium: 4 mmol/L (ref 3.5–5.3)
Sodium: 140 mmol/L (ref 135–146)
Total Bilirubin: 0.5 mg/dL (ref 0.2–1.2)
Total Protein: 6.6 g/dL (ref 6.1–8.1)
eGFR: 96 mL/min/{1.73_m2} (ref >=60)

## 2021-08-10 NOTE — Progress Notes (Signed)
CBC and CMP are normal.

## 2021-08-30 ENCOUNTER — Encounter: Payer: Self-pay | Admitting: Family Medicine

## 2021-08-30 ENCOUNTER — Ambulatory Visit: Payer: 59 | Admitting: Family Medicine

## 2021-08-30 ENCOUNTER — Telehealth: Payer: Self-pay | Admitting: Rheumatology

## 2021-08-30 VITALS — BP 124/80 | HR 101 | Temp 97.8°F | Wt 220.0 lb

## 2021-08-30 DIAGNOSIS — R0981 Nasal congestion: Secondary | ICD-10-CM | POA: Diagnosis not present

## 2021-08-30 DIAGNOSIS — R0989 Other specified symptoms and signs involving the circulatory and respiratory systems: Secondary | ICD-10-CM

## 2021-08-30 LAB — POCT INFLUENZA A/B
Influenza A, POC: NEGATIVE
Influenza B, POC: NEGATIVE

## 2021-08-30 LAB — POC COVID19 BINAXNOW: SARS Coronavirus 2 Ag: NEGATIVE

## 2021-08-30 MED ORDER — FLUTICASONE PROPIONATE 50 MCG/ACT NA SUSP: 2.0000 | Freq: Every day | NASAL | 6 refills | Status: DC

## 2021-08-30 MED ORDER — MONTELUKAST SODIUM 10 MG PO TABS: 10.0000 mg | ORAL_TABLET | Freq: Every day | ORAL | 3 refills | Status: DC

## 2021-08-30 NOTE — Telephone Encounter (Signed)
Patient advised he will need to hold his MTX until he has completed any antibiotics and symptoms have resolved.

## 2021-08-30 NOTE — Telephone Encounter (Signed)
Patient called the office stating he is going to see his PCP this afternoon for a sinus infection that is not resolving. Patient states he has been dealing with this for two weeks. Patient would like to know if he is put on antibiotics this afternoon, will need to hold his MTX? Patient states he took his MTX last Thursday 6/8.

## 2021-08-30 NOTE — Progress Notes (Signed)
   Thomas Davidson is a 61 y.o. male who presents today for an office visit.  Assessment/Plan:  New/Acute Problems: Sinus congestion Negative COVID and flu Patient Allergies, likely worsened by viral infection, cannot rule out bacterial Recommend supportive care with Flonase, will add Singulair Patient on methotrexate, rheumatology recommended that patient discontinue while having acute infection, I agree with this Patient may continue OTC antihistamines as well Return precautions discussed, patient to go to the emergency department if develop severe chest pain or shortness of breath  Chronic Problems Addressed Today: No problem-specific Assessment & Plan notes found for this encounter.     Subjective:  HPI:  Patient presents with 2 weeks dry cough, that did worsen over the past 2 days.  Patient with significant rhinitis over the past several days.  Predominant on the left side.  Denies any headaches, facial pressure, fevers or chills.  Sick contacts are unknown.  Patient has been using OTC Zyrtec, which has helped mildly with the rhinitis but it is worsening as mentioned above.  Patient does have a history of asthma, says he has had a small wheeze at home, but not having any shortness of breath.       Objective:  Physical Exam: BP 124/80 (BP Location: Left Arm, Patient Position: Sitting, Cuff Size: Large)   Pulse (!) 101   Temp 97.8 F (36.6 C) (Temporal)   Wt 220 lb (99.8 kg)   SpO2 98%   BMI 29.03 kg/m   Gen: No acute distress, resting comfortably CV: Regular rate and rhythm with no murmurs appreciated Pulm: Normal work of breathing, clear to auscultation bilaterally with no crackles, wheezes, or rhonchi Neuro: Grossly normal, moves all extremities Psych: Normal affect and thought content      Alesia Banda, MD, MS

## 2021-08-30 NOTE — Patient Instructions (Signed)
Use flonase and singulair, continue zyrtec

## 2021-09-13 ENCOUNTER — Telehealth: Payer: Self-pay | Admitting: Rheumatology

## 2021-09-13 NOTE — Telephone Encounter (Signed)
Patient requested a return call regarding his Methotrexate medication.  Patient states he takes his prescription on Thursday and experiences a lot of fatigue from Friday to Sunday and by Tuesday the joint pain returns in his hands and shoulders.  Patient states he doesn't feel like he gets any relief and is asking if there is another medication he could try.

## 2021-09-14 NOTE — Telephone Encounter (Signed)
Spoke with patient and scheduled an appointment to discuss medication options for 09/26/2021.

## 2021-09-15 NOTE — Progress Notes (Unsigned)
Office Visit Note  Patient: Thomas Davidson             Date of Birth: [birth year]           MRN: [id number]             PCP: Charyl Dancer, NP Referring: Charyl Dancer, NP Visit Date: 09/26/2021 Occupation: '@GUAROCC'$ @  Subjective:  Discuss medication options   History of Present Illness: Thomas Davidson is a 61 y.o. male with history of seropositive rheumatoid arthritis and osteoarthritis.  Patient is currently taking methotrexate 8 tablets by mouth once weekly and folic acid 2 mg daily. He initiated MTX in April 2023. He has been experiencing increased fatigue while taking methotrexate lasting about 3-4 days after the weekly dose.  He denies any other side effects while taking methotrexate. He denies any recent or recurrent infections. He states he continues to have persistent pain and stiffness in both shoulders, both wrists, and both hands.  He states overall his joint swelling has improved but he continues to have breakthrough symptoms.  He presents today to discuss other treatment options.    Activities of Daily Living:  Patient reports morning stiffness for all day. Patient Reports nocturnal pain.  Difficulty dressing/grooming: Denies Difficulty climbing stairs: Denies Difficulty getting out of chair: Denies Difficulty using hands for taps, buttons, cutlery, and/or writing: Reports  Review of Systems  Constitutional:  Positive for fatigue.  HENT:  Negative for mouth sores, mouth dryness and nose dryness.   Eyes:  Negative for pain, itching and dryness.  Respiratory:  Negative for shortness of breath and difficulty breathing.   Cardiovascular:  Negative for chest pain and palpitations.  Gastrointestinal:  Negative for blood in stool, constipation and diarrhea.  Endocrine: Negative for increased urination.  Genitourinary:  Negative for difficulty urinating.  Musculoskeletal:  Positive for joint pain, joint pain, joint swelling and morning stiffness. Negative for myalgias, muscle  tenderness and myalgias.  Skin:  Negative for color change, rash and redness.  Allergic/Immunologic: Negative for susceptible to infections.  Neurological:  Negative for dizziness, numbness, headaches, memory loss and weakness.  Hematological:  Negative for bruising/bleeding tendency.  Psychiatric/Behavioral:  Negative for confusion.     PMFS History:  Patient Active Problem List   Diagnosis Date Noted   Arthralgia of both hands 06/29/2021   Pain in both upper arms 04/25/2021   Elevated blood pressure reading 04/25/2021   History of BPH 06/12/2016   Neuroma 04/05/2015   Allergic rhinitis 11/19/2008    Past Medical History:  Diagnosis Date   Allergy    Asthma    as child   BPH (benign prostatic hyperplasia)    Rheumatoid arthritis ([place])     Family History  Problem Relation Age of Onset   Healthy Mother    Heart disease Father    Diabetes Maternal Grandfather    Healthy Son    Healthy Daughter    Healthy Daughter    Colon cancer Neg Hx    Past Surgical History:  Procedure Laterality Date   NO PAST SURGERIES     Social History   Social History Narrative    Married, 3 children, 6 grandchildren. Works as a Librarian, academic at [employer].      Immunization History  Administered Date(s) Administered   Influenza Split 02/05/2012   Influenza Whole 12/19/2006, 12/18/2008   Influenza,inj,Quad PF,6+ Mos 02/05/2013, 01/26/2014   Influenza-Unspecified 10/19/2014, 01/23/2021   Td 10/20/2002   Tdap 02/05/2013   Zoster Recombinat (  Shingrix) 04/25/2021     Objective: Vital Signs: BP 114/79 (BP Location: Left Arm, Patient Position: Sitting, Cuff Size: Normal)   Pulse 98   Ht '6\' 1"'$  (1.854 m)   Wt 217 lb 6.4 oz (98.6 kg)   BMI 28.68 kg/m    Physical Exam Vitals and nursing note reviewed.  Constitutional:      Appearance: He is well-developed.  HENT:     Head: Normocephalic and atraumatic.  Eyes:     Conjunctiva/sclera: Conjunctivae normal.     Pupils: Pupils are equal,  round, and reactive to light.  Cardiovascular:     Rate and Rhythm: Normal rate and regular rhythm.     Heart sounds: Normal heart sounds.  Pulmonary:     Effort: Pulmonary effort is normal.     Breath sounds: Normal breath sounds.  Abdominal:     General: Bowel sounds are normal.     Palpations: Abdomen is soft.  Musculoskeletal:     Cervical back: Normal range of motion and neck supple.  Skin:    General: Skin is warm and dry.     Capillary Refill: Capillary refill takes less than 2 seconds.  Neurological:     Mental Status: He is alert and oriented to person, place, and time.  Psychiatric:        Behavior: Behavior normal.      Musculoskeletal Exam: C-spine, thoracic spine, and lumbar spine good ROM. No midline spinal tenderness or SI joint tenderness. Shoulder joints, elbow joints, wrist joints, MCPs, PIPs, and DIPs good ROM with no synovitis.  Diffuse swelling and mild erythema on the dorsal aspect of the right hand.  Punctate lesion from insect noted. Hip joints, knee joints, and ankle joints have good ROM with no discomfort. No warmth or effusion of knee joints.  No tenderness or swelling of ankle joints.    CDAI Exam: CDAI Score: 8.6  Patient Global: 3 mm; Provider Global: 3 mm Swollen: 0 ; Tender: 8  Joint Exam 09/26/2021      Right  Left  Glenohumeral   Tender   Tender  Wrist   Tender   Tender  MCP 2   Tender   Tender  MCP 3   Tender     PIP 2   Tender        Investigation: No additional findings.  Imaging: No results found.  Recent Labs: Lab Results  Component Value Date   WBC 9.5 08/09/2021   HGB 15.1 08/09/2021   PLT 244 08/09/2021   NA 140 08/09/2021   K 4.0 08/09/2021   CL 104 08/09/2021   CO2 28 08/09/2021   GLUCOSE 86 08/09/2021   BUN 12 08/09/2021   CREATININE 0.92 08/09/2021   BILITOT 0.5 08/09/2021   ALKPHOS 80 04/25/2021   AST 11 08/09/2021   ALT 13 08/09/2021   PROT 6.6 08/09/2021   ALBUMIN 4.0 04/25/2021   CALCIUM 9.2 08/09/2021    GFRAA 104 01/03/2007   QFTBGOLDPLUS NEGATIVE 07/08/2021    Speciality Comments: MTX 06/2021  Procedures:  No procedures performed Allergies: Bee venom   Assessment / Plan:     Visit Diagnoses: Rheumatoid arthritis involving multiple sites with positive rheumatoid factor (HCC) - +RF, +CCP Ab, ANA-, synovitis noted in multiple joints: Patient presents today to discuss different treatment options.  He was initially started on methotrexate in April 2023.  He has been experiencing fatigue for 3 to 4 days after his weekly methotrexate dose.  He has also continued to have  breakthrough symptoms especially in the shoulders, both wrists, and both hands.  He has ongoing stiffness and decreased grip strength in both hands.  Overall he has not found methotrexate to be effective at managing his rheumatoid arthritis and also feels discouraged by the level of fatigue he experiences on a weekly basis.  Different treatment options were discussed today in detail.  Indications, contraindications, and potential side effects of Humira were discussed.  All questions were addressed and consent was obtained.  We will apply for Humira through his insurance and once approved he will return to the office for ministration of the first injection.  He was advised to reduce the dose of methotrexate to 6 tablets once weekly and remain on folic acid 2 mg daily.  We can further discuss tapering the dose of methotrexate or complete discontinuation at his follow-up visit. A prednisone taper starting at 20 mg tapering by 5 mg every 2 days was sent to the pharmacy today to treat his current symptoms.   He will follow up in 6-8 weeks to assess his response to humira.    Counseled patient that Humira is a TNF blocking agent.  Counseled patient on purpose, proper use, and adverse effects of Humira.  Reviewed the most common adverse effects including infections, headache, and injection site reactions. Discussed that there is the possibility  of an increased risk of malignancy including non-melanoma skin cancer but it is not well understood if this increased risk is due to the medication or the disease state.  Advised patient to get yearly dermatology exams due to risk of skin cancer. Counseled patient that Humira should be held prior to scheduled surgery.  Counseled patient to avoid live vaccines while on Humira.  Recommend annual influenza, PCV 15 or PCV20 or Pneumovax 23, and Shingrix as indicated.  Reviewed the importance of regular labs while on Humira therapy.  Will monitor CBC and CMP 1 month after starting and then every 3 months routinely thereafter. Will monitor TB gold annually. Standing orders placed.    Provided patient with medication education material and answered all questions.  Patient consented to Humira.  Will upload consent into the media tab.  Reviewed storage instructions of Humira.  Advised initial injection must be administered in office.  Patient verbalized understanding.   Dermatology referral will be placed at new start visit.  Dose will be for rheumatoid arthritis Humira 40 mg every 14 days.  Prescription pending lab results and/or insurance approval.  High risk medication use -Applying for Humira 40 mg sq injections every 14 days.  He initiated methotrexate in April 2023 and has been experiencing significant fatigue lasting 3 to 4 days after each weekly dose.  He plans on reducing methotrexate to 6 tablets once weekly and remaining on folic acid 2 mg daily.  Once Humira has been initiated and is fully in his system he will try further tapering the dose of methotrexate to try to minimize side effects. CBC and CMP were drawn on 08/09/2021.  CBC and CMP were drawn today prior to initiating Humira.  He will return for lab work in 1 month and every 3 months to monitor for drug toxicity.- Plan: CBC with Differential/Platelet, COMPLETE METABOLIC PANEL WITH GFR TB Gold negative on 07/08/2021. Discussed the importance of  holding Humira and methotrexate if he develops signs or symptoms of an infection and to resume once the infection is completely cleared. Discussed the importance of yearly skin examinations while on Humira.  Other fatigue: He has been  experiencing increased fatigue for 3-4 days after taking his weekly methotrexate dose.  He has remained on methotrexate 8 tablets once weekly as prescribed and is taking folic acid 2 mg daily.    He has not had any recent or recurrent infections while taking methotrexate. No other SE. He will reduce methotrexate to 6 tablets weekly and remain on folic acid 2 mg daily.  He will be initiating Humira once approved by his insurance.  We can further discuss tapering the dose or completely discontinuing methotrexate at his follow-up visit in 6 to 8 weeks.   Primary osteoarthritis of both hands: He has PIP and DIP thickening consistent with osteoarthritis of both hands. He continues to have pain and stiffness in both hands.  He has also noticed decreased grip strength.  Discussed the importance of joint protection and muscle strengthening.    Chronic pain of both knees - XR unremarkable.  He has good ROM of both knee joints with no discomfort at this time.  No warmth or effusion noted.   Primary osteoarthritis of both feet: He is not experiencing any pain or stiffness in his ankles or feet at this time.   Wasp sting, accidental or unintentional, initial encounter: Dorsal aspect of right hand-diffuse swelling and mild erythema noted.  He tried taking zytrec and using topical witch hazel.  A prednisone taper was sent to the pharmacy.   Other medical conditions are listed as follows:   Neuroma  Seasonal allergic rhinitis due to pollen  History of BPH    Orders: Orders Placed This Encounter  Procedures   CBC with Differential/Platelet   COMPLETE METABOLIC PANEL WITH GFR   Meds ordered this encounter  Medications   predniSONE (DELTASONE) 5 MG tablet    Sig: Take 4  tablets by mouth daily x2 days, 3 tablets daily x2 days, 2 tablets daily x2 days, 1 tablet daily x2 days.    Dispense:  20 tablet    Refill:  0      Follow-Up Instructions: Return in about 6 weeks (around 11/07/2021) for Rheumatoid arthritis, Osteoarthritis.   Ofilia Neas, PA-C  Note - This record has been created using Dragon software.  Chart creation errors have been sought, but may not always  have been located. Such creation errors do not reflect on  the standard of medical care.

## 2021-09-26 ENCOUNTER — Ambulatory Visit: Payer: 59 | Admitting: Physician Assistant

## 2021-09-26 ENCOUNTER — Telehealth: Payer: Self-pay | Admitting: Pharmacist

## 2021-09-26 ENCOUNTER — Encounter: Payer: Self-pay | Admitting: Physician Assistant

## 2021-09-26 VITALS — BP 114/79 | HR 98 | Ht 73.0 in | Wt 217.4 lb

## 2021-09-26 DIAGNOSIS — Z79899 Other long term (current) drug therapy: Secondary | ICD-10-CM | POA: Diagnosis not present

## 2021-09-26 DIAGNOSIS — M0579 Rheumatoid arthritis with rheumatoid factor of multiple sites without organ or systems involvement: Secondary | ICD-10-CM | POA: Diagnosis not present

## 2021-09-26 DIAGNOSIS — R5383 Other fatigue: Secondary | ICD-10-CM

## 2021-09-26 DIAGNOSIS — G8929 Other chronic pain: Secondary | ICD-10-CM

## 2021-09-26 DIAGNOSIS — M19072 Primary osteoarthritis, left ankle and foot: Secondary | ICD-10-CM

## 2021-09-26 DIAGNOSIS — M25561 Pain in right knee: Secondary | ICD-10-CM

## 2021-09-26 DIAGNOSIS — T63461A Toxic effect of venom of wasps, accidental (unintentional), initial encounter: Secondary | ICD-10-CM

## 2021-09-26 DIAGNOSIS — M19041 Primary osteoarthritis, right hand: Secondary | ICD-10-CM

## 2021-09-26 DIAGNOSIS — M19042 Primary osteoarthritis, left hand: Secondary | ICD-10-CM

## 2021-09-26 DIAGNOSIS — Z87438 Personal history of other diseases of male genital organs: Secondary | ICD-10-CM

## 2021-09-26 DIAGNOSIS — M25562 Pain in left knee: Secondary | ICD-10-CM

## 2021-09-26 DIAGNOSIS — M19071 Primary osteoarthritis, right ankle and foot: Secondary | ICD-10-CM

## 2021-09-26 DIAGNOSIS — J301 Allergic rhinitis due to pollen: Secondary | ICD-10-CM

## 2021-09-26 DIAGNOSIS — D3610 Benign neoplasm of peripheral nerves and autonomic nervous system, unspecified: Secondary | ICD-10-CM

## 2021-09-26 MED ORDER — PREDNISONE 5 MG PO TABS: ORAL_TABLET | ORAL | 0 refills | Status: DC

## 2021-09-26 NOTE — Telephone Encounter (Signed)
Please start Humira BIV.  Dose: '40mg'$  SQ every 14 days  Dx: Rheumatoid arthritis (M05.9)  Previously tried therapies: Methotrexate - current (started April 2023), inadequate clinical response  Once approved, patient is eligible for Humira copay card.  Knox Saliva, PharmD, MPH, BCPS, CPP Clinical Pharmacist (Rheumatology and Pulmonology)

## 2021-09-26 NOTE — Telephone Encounter (Signed)
Submitted a Prior Authorization request to [facility] for HUMIRA via CoverMyMeds. Will update once we receive a response.   Key: [id number]

## 2021-09-26 NOTE — Patient Instructions (Signed)
Standing Labs We placed an order today for your standing lab work.   Please have your standing labs drawn in 1 month and then every 3 months   If possible, please have your labs drawn 2 weeks prior to your appointment so that the provider can discuss your results at your appointment.  Please note that you may see your imaging and lab results in [place] before we have reviewed them. We may be awaiting multiple results to interpret others before contacting you. Please allow our office up to 72 hours to thoroughly review all of the results before contacting the office for clarification of your results.  We have open lab daily: Monday through Thursday from 1:30-4:30 PM and Friday from 1:30-4:00 PM at the office of Dr. Bo Merino, [facility].   Please be advised, all patients with office appointments requiring lab work will take precedent over walk-in lab work.  If possible, please come for your lab work on Monday and Friday afternoons, as you may experience shorter wait times. The office is located at [address], [place], [place],  [address] No appointment is necessary.   Labs are drawn by [facility]. Please bring your co-pay at the time of your lab draw.  You may receive a bill from [facility] for your lab work.  Please note if you are on Hydroxychloroquine and and an order has been placed for a Hydroxychloroquine level, you will need to have it drawn 4 hours or more after your last dose.  If you wish to have your labs drawn at another location, please call the office 24 hours in advance to send orders.  If you have any questions regarding directions or hours of operation,  please call [phone].   As a reminder, please drink plenty of water prior to coming for your lab work. Thanks!   Adalimumab Injection What is this medication? ADALIMUMAB (ay da LIM yoo mab) is used to treat rheumatoid and psoriatic arthritis. It is also used to treat ankylosing spondylitis,  Crohn's disease, ulcerative colitis, plaque psoriasis, hidradenitis suppurativa, and uveitis. This medicine may be used for other purposes; ask your health care provider or pharmacist if you have questions. COMMON BRAND NAME(S): AMJEVITA, Humira What should I tell my care team before I take this medication? They need to know if you have any of these conditions: cancer diabetes (high blood sugar) having surgery heart disease hepatitis B immune system problems infections, such as tuberculosis (TB) or other bacterial, fungal, or viral infections multiple sclerosis recent or upcoming vaccine an unusual reaction to adalimumab, mannitol, latex, rubber, other medicines, foods, dyes, or preservatives pregnant or trying to get pregnant breast-feeding How should I use this medication? This medicine is for injection under the skin. You will be taught how to prepare and give it. Take it as directed on the prescription label. Keep taking it unless your health care provider tells you to stop. It is important that you put your used needles and syringes in a special sharps container. Do not put them in a trash can. If you do not have a sharps container, call your pharmacist or health care provider to get one. This medicine comes with INSTRUCTIONS FOR USE. Ask your pharmacist for directions on how to use this medicine. Read the information carefully. Talk to your pharmacist or health care provider if you have questions. A special MedGuide will be given to you by the pharmacist with each prescription and refill. Be sure to read this information carefully each  time. Talk to your pediatrician regarding the use of this medicine in children. While this drug may be prescribed for children as young as 2 years for selected conditions, precautions do apply. Overdosage: If you think you have taken too much of this medicine contact a poison control center or emergency room at once. NOTE: This medicine is only for you.  Do not share this medicine with others. What if I miss a dose? If you miss a dose, take it as soon as you can. If it is almost time for your next dose, take only that dose. Do not take double or extra doses. It is important not to miss any doses. Talk to your health care provider about what to do if you miss a dose. What may interact with this medication? Do not take this medicine with any of the following medications: abatacept anakinra biologic medicines such as certolizumab, etanercept, golimumab, infliximab live virus vaccines This medicine may also interact with the following medications: cyclosporine theophylline vaccines warfarin This list may not describe all possible interactions. Give your health care provider a list of all the medicines, herbs, non-prescription drugs, or dietary supplements you use. Also tell them if you smoke, drink alcohol, or use illegal drugs. Some items may interact with your medicine. What should I watch for while using this medication? Visit your health care provider for regular checks on your progress. Tell your health care provider if your symptoms do not start to get better or if they get worse. You will be tested for tuberculosis (TB) before you start this medicine. If your doctor prescribes any medicine for TB, you should start taking the TB medicine before starting this medicine. Make sure to finish the full course of TB medicine. This medicine may increase your risk of getting an infection. Call your health care provider for advice if you get a fever, chills, sore throat, or other symptoms of a cold or flu. Do not treat yourself. Try to avoid being around people who are sick. Talk to your health care provider about your risk of cancer. You may be more at risk for certain types of cancer if you take this medicine. What side effects may I notice from receiving this medication? Side effects that you should report to your doctor or health care professional  as soon as possible: allergic reactions like skin rash, itching or hives, swelling of the face, lips, or tongue changes in vision chest pain dizziness heart failure (trouble breathing; fast, irregular heartbeat; sudden weight gain; swelling of the ankles, feet, hands; unusually weak or tired) infection (fever, chills, cough, sore throat, pain or trouble passing urine) liver injury (dark yellow or brown urine; general ill feeling or flu-like symptoms; loss of appetite, right upper belly pain; unusually weak or tired, yellowing of the eyes or skin) lump or swollen lymph nodes on the neck, groin, or underarm area muscle weakness pain, tingling, numbness in the hands or feet red, scaly patches or raised bumps on the skin trouble breathing unusual bleeding or bruising unusually weak or tired Side effects that usually do not require medical attention (report to your doctor or health care professional if they continue or are bothersome): headache nausea pain, redness, or irritation at site where injected stuffy or runny nose This list may not describe all possible side effects. Call your doctor for medical advice about side effects. You may report side effects to FDA at 1-800-FDA-1088. Where should I keep my medication? Keep out of the reach of children  and pets. Store in the refrigerator between 2 and 8 degrees C (36 and 46 degrees F). Do not freeze. Keep this medicine in the original packaging until you are ready to take it. Protect from light. Get rid of any unused medicine after the expiration date. This medicine may be stored at room temperature for up to 14 days. Keep this medicine in the original packaging. Protect from light. If it is stored at room temperature, get rid of any unused medicine after 14 days or after it expires, whichever is first. To get rid of medicines that are no longer needed or have expired: Take the medicine to a medicine take-back program. Check with your pharmacy or  law enforcement to find a location. If you cannot return the medicine, ask your pharmacist or health care provider how to get rid of this medicine safely. NOTE: This sheet is a summary. It may not cover all possible information. If you have questions about this medicine, talk to your doctor, pharmacist, or health care provider.  2023 Elsevier/Gold Standard (2021-02-04 00:00:00)

## 2021-09-26 NOTE — Progress Notes (Signed)
Pharmacy Note Subjective: Patient presents today to [facility] for follow up office visit. Patient seen by the pharmacist for counseling on Humira for rheumatoid arthritis.  Prior therapy includes: methotrexate (inadequate clinical response).  Diagnosis of heart failure: No  Objective:  CBC    Component Value Date/Time   WBC 9.5 08/09/2021 0950   RBC 4.68 08/09/2021 0950   HGB 15.1 08/09/2021 0950   HCT 42.6 08/09/2021 0950   PLT 244 08/09/2021 0950   MCV 91.0 08/09/2021 0950   MCH 32.3 08/09/2021 0950   MCHC 35.4 08/09/2021 0950   RDW 13.9 08/09/2021 0950   LYMPHSABS 1,653 08/09/2021 0950   MONOABS 0.6 04/25/2021 0943   EOSABS 741 (H) 08/09/2021 0950   BASOSABS 48 08/09/2021 0950     CMP     Component Value Date/Time   NA 140 08/09/2021 0950   K 4.0 08/09/2021 0950   CL 104 08/09/2021 0950   CO2 28 08/09/2021 0950   GLUCOSE 86 08/09/2021 0950   BUN 12 08/09/2021 0950   CREATININE 0.92 08/09/2021 0950   CALCIUM 9.2 08/09/2021 0950   PROT 6.6 08/09/2021 0950   ALBUMIN 4.0 04/25/2021 0943   AST 11 08/09/2021 0950   ALT 13 08/09/2021 0950   ALKPHOS 80 04/25/2021 0943   BILITOT 0.5 08/09/2021 0950   GFRNONAA >60 03/19/2021 1941   GFRAA 104 01/03/2007 0943      Baseline Immunosuppressant Therapy Labs TB GOLD    Latest Ref Rng & Units 07/08/2021   10:33 AM  Quantiferon TB Gold  Quantiferon TB Gold Plus NEGATIVE NEGATIVE    Hepatitis Panel    Latest Ref Rng & Units 07/08/2021   10:33 AM  Hepatitis  Hep B Surface Ag NON-REACTIVE NON-REACTIVE   Hep B IgM NON-REACTIVE NON-REACTIVE   Hep C Ab NON-REACTIVE NON-REACTIVE    HIV Lab Results  Component Value Date   HIV NON-REACTIVE 07/08/2021   HIV NON-REACTIVE 04/25/2021   Immunoglobulins    Latest Ref Rng & Units 07/08/2021   10:33 AM  Immunoglobulin Electrophoresis  IgA  47 - 310 mg/dL 488   IgG 600 - 1,640 mg/dL 1,116   IgM 50 - 300 mg/dL 161    SPEP    Latest Ref Rng & Units 08/09/2021     9:50 AM  Serum Protein Electrophoresis  Total Protein 6.1 - 8.1 g/dL 6.6    G6PD Lab Results  Component Value Date   G6PDH 17.8 07/08/2021   TPMT No results found for: "TPMT"   Chest x-ray: 07/08/21 - No active cardiopulmonary disease.  Assessment/Plan:  Counseled patient that Humira is a TNF blocking agent.  Counseled patient on purpose, proper use, and adverse effects of Humira.  Reviewed the most common adverse effects including infections, headache, and injection site reactions. Discussed that there is the possibility of an increased risk of malignancy including non-melanoma skin cancer but it is not well understood if this increased risk is due to the medication or the disease state.  Advised patient to get yearly dermatology exams due to risk of skin cancer. Counseled patient that Humira should be held prior to scheduled surgery.  Counseled patient to avoid live vaccines while on Humira.  Recommend annual influenza, PCV 15 or PCV20 or Pneumovax 23, and Shingrix as indicated.  Reviewed the importance of regular labs while on Humira therapy. Will monitor CBC and CMP 1 month after starting and then every 3 months routinely thereafter. Will monitor TB gold annually. Standing orders placed. Provided  patient with medication education material and answered all questions.  Patient consented to Humira.  Will upload consent into the media tab.  Reviewed storage instructions of Humira.  Advised initial injection must be administered in office.  Patient verbalized understanding.   Dermatology referral will be placed at new start visit.  Dose will be for rheumatoid arthritis Humira 40 mg every 14 days.  Prescription pending lab results and/or insurance approval.  Knox Saliva, PharmD, MPH, BCPS, CPP Clinical Pharmacist (Rheumatology and Pulmonology)

## 2021-09-27 LAB — CBC WITH DIFFERENTIAL/PLATELET
Absolute Monocytes: 541 cells/uL (ref 200–950)
Basophils Absolute: 49 cells/uL (ref 0–200)
Basophils Relative: 0.6 %
Eosinophils Absolute: 320 cells/uL (ref 15–500)
Eosinophils Relative: 3.9 %
HCT: 44 % (ref 38.5–50.0)
Hemoglobin: 15 g/dL (ref 13.2–17.1)
Lymphs Abs: 1443 cells/uL (ref 850–3900)
MCH: 31.7 pg (ref 27.0–33.0)
MCHC: 34.1 g/dL (ref 32.0–36.0)
MCV: 93 fL (ref 80.0–100.0)
MPV: 9.9 fL (ref 7.5–12.5)
Monocytes Relative: 6.6 %
Neutro Abs: 5847 cells/uL (ref 1500–7800)
Neutrophils Relative %: 71.3 %
Platelets: 212 10*3/uL (ref 140–400)
RBC: 4.73 10*6/uL (ref 4.20–5.80)
RDW: 13.7 % (ref 11.0–15.0)
Total Lymphocyte: 17.6 %
WBC: 8.2 10*3/uL (ref 3.8–10.8)

## 2021-09-27 LAB — COMPLETE METABOLIC PANEL WITH GFR
AG Ratio: 1.5 (calc) (ref 1.0–2.5)
ALT: 17 U/L (ref 9–46)
AST: 15 U/L (ref 10–35)
Albumin: 4.1 g/dL (ref 3.6–5.1)
Alkaline phosphatase (APISO): 71 U/L (ref 35–144)
BUN: 12 mg/dL (ref 7–25)
CO2: 28 mmol/L (ref 20–32)
Calcium: 9.3 mg/dL (ref 8.6–10.3)
Chloride: 104 mmol/L (ref 98–110)
Creat: 0.96 mg/dL (ref 0.70–1.30)
Globulin: 2.7 g/dL (calc) (ref 1.9–3.7)
Glucose, Bld: 80 mg/dL (ref 65–99)
Potassium: 3.9 mmol/L (ref 3.5–5.3)
Sodium: 141 mmol/L (ref 135–146)
Total Bilirubin: 0.7 mg/dL (ref 0.2–1.2)
Total Protein: 6.8 g/dL (ref 6.1–8.1)
eGFR: 91 mL/min/{1.73_m2} (ref >=60)

## 2021-09-27 NOTE — Progress Notes (Signed)
CBC and CMP WNL

## 2021-09-28 NOTE — Telephone Encounter (Addendum)
Received fax from [facility] requesting clinicals showing patient has taken MTX '15mg'$  weekly for 3 months. Of note, patient has taken MTX for about 9-10 weeks only but submitted clinicals indicating patient does not want to take due to heart disease concerns.  Fax: [phone] Phone: [phone] Case ID # [id number]  Knox Saliva, PharmD, MPH, BCPS, CPP Clinical Pharmacist (Rheumatology and Pulmonology)

## 2021-09-29 NOTE — Telephone Encounter (Signed)
Received notification from [facility] regarding a prior authorization for [place]. Authorization has been APPROVED from 09/28/21 to 09/29/22.   Unable to run test claim as patient must fill through [facility]: [id number]  Authorization #  [id number]  Enrolled patient into [employer] Complete [employer] portal. Awaiting copay card information to populate.  Knox Saliva, PharmD, MPH, BCPS, CPP Clinical Pharmacist (Rheumatology and Pulmonology)

## 2021-09-30 NOTE — Telephone Encounter (Signed)
Humira copay card: ID: [id number] GROUP: [id number] BIN: [id number] PCN: [id number]  He will reduce methotrexate to 6 tablets weekly and remain on folic acid 2 mg daily.  He will be initiating Humira once approved by his insurance.  We can further discuss tapering the dose or completely discontinuing methotrexate at his follow-up visit in 6 to 8 weeks.   Patient took his last dose of MTX on 09/29/21  Humira new start visit scheduled for 10/03/21. Will use sample for first dose  Knox Saliva, PharmD, MPH, BCPS, CPP Clinical Pharmacist (Rheumatology and Pulmonology)

## 2021-10-03 ENCOUNTER — Ambulatory Visit (INDEPENDENT_AMBULATORY_CARE_PROVIDER_SITE_OTHER): Payer: 59 | Admitting: Pharmacist

## 2021-10-03 VITALS — BP 120/77 | HR 101

## 2021-10-03 DIAGNOSIS — Z7189 Other specified counseling: Secondary | ICD-10-CM

## 2021-10-03 DIAGNOSIS — M0579 Rheumatoid arthritis with rheumatoid factor of multiple sites without organ or systems involvement: Secondary | ICD-10-CM

## 2021-10-03 DIAGNOSIS — Z79899 Other long term (current) drug therapy: Secondary | ICD-10-CM

## 2021-10-03 MED ORDER — METHOTREXATE 2.5 MG PO TABS: 15.0000 mg | ORAL_TABLET | ORAL | 0 refills | Status: DC

## 2021-10-03 MED ORDER — HUMIRA (2 PEN) 40 MG/0.4ML ~~LOC~~ AJKT: 40.0000 mg | AUTO-INJECTOR | SUBCUTANEOUS | 0 refills | Status: DC

## 2021-10-03 NOTE — Patient Instructions (Signed)
Your next HUMIRA dose is due on 10/17/21, 10/31/21, and every 14 days thereafter  REDUCE your methotrexate to 6 tablets ('15mg'$ ) once weekly. CONTINUE folic acid '2mg'$  daily  HOLD HUMIRA and METHOTREXATE if you have signs or symptoms of an infection. You can resume once you feel better or back to your baseline. HOLD HUMIRA and METHOTREXATE if you start antibiotics to treat an infection. HOLD HUMIRA and METHOTREXATE around the time of surgery/procedures. Your surgeon will be able to provide recommendations on when to hold BEFORE and when you are cleared to [place].  Pharmacy information: Your prescription will be shipped from [facility]. Their phone number is [phone] Please call to schedule shipment and confirm address. They will mail your medication to your home.  Cost information: Your copay should be affordable. If you call the pharmacy and it is not affordable, please double-check that they are billing through your copay card as secondary coverage. That copay card information is: ID: [id number] GROUP: [id number] BIN: [id number] PCN: [id number]  Labs are due in 1 month then every 3 months. Lab hours are from Monday to Thursday 1:30-4:30pm and Friday 1:30-4pm. You do not need an appointment if you come for labs during these times.  How to manage an injection site reaction: Remember the 5 C's: COUNTER - leave on the counter at least 30 minutes but up to overnight to bring medication to room temperature. This may help prevent stinging COLD - place something cold (like an ice gel pack or cold water bottle) on the injection site just before cleansing with alcohol. This may help reduce pain CLARITIN - use Claritin (generic name is loratadine) for the first two weeks of treatment or the day of, the day before, and the day after injecting. This will help to minimize injection site reactions CORTISONE CREAM - apply if injection site is irritated and itching CALL ME - if injection site  reaction is bigger than the size of your fist, looks infected, blisters, or if you develop hives

## 2021-10-03 NOTE — Progress Notes (Addendum)
Pharmacy Note  Subjective:   Patient presents to clinic today to receive first dose of Humira for rheumatoid arthritis. Patient currently takes methotrexate tablets. His last dose of methotrexate was on 09/29/21 (took a dose of '20mg'$ ).  Patient running a fever or have signs/symptoms of infection? No  Patient currently on antibiotics for the treatment of infection? No  Patient have any upcoming invasive procedures/surgeries? No  Objective: CMP     Component Value Date/Time   NA 141 09/26/2021 0936   K 3.9 09/26/2021 0936   CL 104 09/26/2021 0936   CO2 28 09/26/2021 0936   GLUCOSE 80 09/26/2021 0936   BUN 12 09/26/2021 0936   CREATININE 0.96 09/26/2021 0936   CALCIUM 9.3 09/26/2021 0936   PROT 6.8 09/26/2021 0936   ALBUMIN 4.0 04/25/2021 0943   AST 15 09/26/2021 0936   ALT 17 09/26/2021 0936   ALKPHOS 80 04/25/2021 0943   BILITOT 0.7 09/26/2021 0936   GFRNONAA >60 03/19/2021 1941   GFRAA 104 01/03/2007 0943    CBC    Component Value Date/Time   WBC 8.2 09/26/2021 0936   RBC 4.73 09/26/2021 0936   HGB 15.0 09/26/2021 0936   HCT 44.0 09/26/2021 0936   PLT 212 09/26/2021 0936   MCV 93.0 09/26/2021 0936   MCH 31.7 09/26/2021 0936   MCHC 34.1 09/26/2021 0936   RDW 13.7 09/26/2021 0936   LYMPHSABS 1,443 09/26/2021 0936   MONOABS 0.6 04/25/2021 0943   EOSABS 320 09/26/2021 0936   BASOSABS 49 09/26/2021 0936    Baseline Immunosuppressant Therapy Labs TB GOLD    Latest Ref Rng & Units 07/08/2021   10:33 AM  Quantiferon TB Gold  Quantiferon TB Gold Plus NEGATIVE NEGATIVE    Hepatitis Panel    Latest Ref Rng & Units 07/08/2021   10:33 AM  Hepatitis  Hep B Surface Ag NON-REACTIVE NON-REACTIVE   Hep B IgM NON-REACTIVE NON-REACTIVE   Hep C Ab NON-REACTIVE NON-REACTIVE    HIV Lab Results  Component Value Date   HIV NON-REACTIVE 07/08/2021   HIV NON-REACTIVE 04/25/2021   Immunoglobulins    Latest Ref Rng & Units 07/08/2021   10:33 AM  Immunoglobulin  Electrophoresis  IgA  47 - 310 mg/dL 488   IgG 600 - 1,640 mg/dL 1,116   IgM 50 - 300 mg/dL 161    SPEP    Latest Ref Rng & Units 09/26/2021    9:36 AM  Serum Protein Electrophoresis  Total Protein 6.1 - 8.1 g/dL 6.8    G6PD Lab Results  Component Value Date   G6PDH 17.8 07/08/2021   Chest x-ray: 07/08/21 - No active cardiopulmonary disease.  Assessment/Plan:  Demonstrated proper injection technique with Humira demo device  Patient able to demonstrate proper injection technique using the teach back method.  Patient self injected in the right abdomen with:  Sample Medication: Humira '40mg'$ /0.77m autoinjector NDC: 016109-6045-40Lot: [id number]: 09/2022  Patient tolerated well.  Observed for 30 mins in office for adverse reaction. Patient had redness at injection site but does not report itchiness or irritation. No bump notes. Reviewed injection site reaction management with patient.   Patient is to return in 1 month for labs and 6-8 weeks for follow-up appointment.  Standing orders placed.  [facility] placed today for yearly skin checks while on TNF inhibitor d/t risk for non-melanoma skin cancer. Pt aware it may take several weeks to months for this appt to be scheduled  Humira approved through insurance .  [facility]: [phone].  Patient provided with pharmacy phone number and advised to call later this week to schedule shipment to home.  Patient will continue Humira '40mg'$  SQ every 14 days days. He will reduce methotrexate to 6 tablets ('15mg'$  once weekly) starting this Thursday, 10/06/21  All questions encouraged and answered.  Instructed patient to call with any further questions or concerns.  Knox Saliva, PharmD, MPH, BCPS, CPP Clinical Pharmacist (Rheumatology and Pulmonology)  10/03/2021 8:13 AM

## 2021-10-03 NOTE — Addendum Note (Signed)
Addended by: Cassandria Anger on: 10/03/2021 10:30 AM   Modules accepted: Orders

## 2021-11-01 NOTE — Progress Notes (Unsigned)
Office Visit Note  Patient: Thomas Davidson             Date of Birth: 01-25-1961           MRN: [id number]             PCP: Charyl Dancer, NP Referring: Charyl Dancer, NP Visit Date: 11/15/2021 Occupation: '@GUAROCC'$ @  Subjective:  Medication monitoring   History of Present Illness: Thomas Davidson is a 61 y.o. male with history of seropositive rheumatoid arthritis and osteoarthritis.  He remains on Humira 40 mg sq injections every 14 days, methotrexate to 6 tablets once weekly, and folic acid 2 mg daily.  He initiated Humira on 10/03/2021.  He is tolerating Humira without any side effects or injection site reactions.  He states he has noticed about a 25% improvement in his joint pain, stiffness, and inflammation since initiating Humira.  He states that he has had less frequent and less severe pain.  He continues to have stiffness in both hands lasting 1 to 2 hours every morning.  He also has ongoing fatigue which has started to improve gradually. He denies any recent or recurrent infections.  He denies any new medical conditions.     Activities of Daily Living:  Patient reports morning stiffness for 1-2 hours.   Patient Reports nocturnal pain.  Difficulty dressing/grooming: Denies Difficulty climbing stairs: Denies Difficulty getting out of chair: Denies Difficulty using hands for taps, buttons, cutlery, and/or writing: Reports  Review of Systems  Constitutional:  Positive for fatigue.  HENT:  Negative for mouth sores and mouth dryness.   Eyes:  Negative for dryness.  Respiratory:  Negative for shortness of breath.   Cardiovascular:  Negative for chest pain and palpitations.  Gastrointestinal:  Negative for blood in stool, constipation and diarrhea.  Endocrine: Negative for increased urination.  Genitourinary:  Negative for involuntary urination.  Musculoskeletal:  Positive for joint pain, joint pain and morning stiffness. Negative for gait problem, joint swelling, myalgias, muscle  weakness, muscle tenderness and myalgias.  Skin:  Negative for color change, rash and sensitivity to sunlight.  Allergic/Immunologic: Negative for susceptible to infections.  Neurological:  Negative for dizziness and headaches.  Hematological:  Negative for swollen glands.  Psychiatric/Behavioral:  Negative for depressed mood and sleep disturbance. The patient is not nervous/anxious.     PMFS History:  Patient Active Problem List   Diagnosis Date Noted   Arthralgia of both hands 06/29/2021   Pain in both upper arms 04/25/2021   Elevated blood pressure reading 04/25/2021   History of BPH 06/12/2016   Neuroma 04/05/2015   Allergic rhinitis 11/19/2008    Past Medical History:  Diagnosis Date   Allergy    Asthma    as child   BPH (benign prostatic hyperplasia)    Rheumatoid arthritis ([place])     Family History  Problem Relation Age of Onset   Healthy Mother    Heart disease Father    Diabetes Maternal Grandfather    Healthy Son    Healthy Daughter    Healthy Daughter    Colon cancer Neg Hx    Past Surgical History:  Procedure Laterality Date   NO PAST SURGERIES     Social History   Social History Narrative    Married, 3 children, 6 grandchildren. Works as a Librarian, academic at [employer].      Immunization History  Administered Date(s) Administered   Influenza Split 02/05/2012   Influenza Whole 12/19/2006, 12/18/2008  Influenza,inj,Quad PF,6+ Mos 02/05/2013, 01/26/2014   Influenza-Unspecified 10/19/2014, 01/23/2021   Td 10/20/2002   Tdap 02/05/2013   Zoster Recombinat (Shingrix) 04/25/2021     Objective: Vital Signs: BP 134/84 (BP Location: Left Arm, Patient Position: Sitting, Cuff Size: Normal)   Pulse (!) 108   Resp 15   Ht '6\' 1"'$  (1.854 m)   Wt 216 lb 6.4 oz (98.2 kg)   BMI 28.55 kg/m    Physical Exam Vitals and nursing note reviewed.  Constitutional:      Appearance: He is well-developed.  HENT:     Head: Normocephalic and atraumatic.  Eyes:      Conjunctiva/sclera: Conjunctivae normal.     Pupils: Pupils are equal, round, and reactive to light.  Cardiovascular:     Rate and Rhythm: Normal rate and regular rhythm.     Heart sounds: Normal heart sounds.  Pulmonary:     Effort: Pulmonary effort is normal.     Breath sounds: Normal breath sounds.  Abdominal:     General: Bowel sounds are normal.     Palpations: Abdomen is soft.  Musculoskeletal:     Cervical back: Normal range of motion and neck supple.  Skin:    General: Skin is warm and dry.     Capillary Refill: Capillary refill takes less than 2 seconds.  Neurological:     Mental Status: He is alert and oriented to person, place, and time.  Psychiatric:        Behavior: Behavior normal.      Musculoskeletal Exam: C-spine, thoracic spine, and lumbar spine good ROM. Shoulder joints, elbow joints, wrist joints, MCPs, PIPs, and DIPs good ROM with no synovitis.  Tenderness and stiffness of right 2nd and 3rd PIP joints. Complete fist formation bilaterally.  PIP and DIP thickening consistent with osteoarthritis of both hands. Hip joints have good ROM.  Knee joints have good ROM with no warmth or effusion.    CDAI Exam: CDAI Score: 0.4  Patient Global: 2 mm; Provider Global: 2 mm Swollen: 0 ; Tender: 0  Joint Exam 11/15/2021   No joint exam has been documented for this visit   There is currently no information documented on the homunculus. Go to the Rheumatology activity and complete the homunculus joint exam.  Investigation: No additional findings.  Imaging: No results found.  Recent Labs: Lab Results  Component Value Date   WBC 8.2 09/26/2021   HGB 15.0 09/26/2021   PLT 212 09/26/2021   NA 141 09/26/2021   K 3.9 09/26/2021   CL 104 09/26/2021   CO2 28 09/26/2021   GLUCOSE 80 09/26/2021   BUN 12 09/26/2021   CREATININE 0.96 09/26/2021   BILITOT 0.7 09/26/2021   ALKPHOS 80 04/25/2021   AST 15 09/26/2021   ALT 17 09/26/2021   PROT 6.8 09/26/2021   ALBUMIN  4.0 04/25/2021   CALCIUM 9.3 09/26/2021   GFRAA 104 01/03/2007   QFTBGOLDPLUS NEGATIVE 07/08/2021    Speciality Comments: MTX 06/2021  Procedures:  No procedures performed Allergies: Bee venom   Assessment / Plan:     Visit Diagnoses: Rheumatoid arthritis involving multiple sites with positive rheumatoid factor (HCC) - +RF, +CCP Ab, ANA-, synovitis noted in multiple joints: He has no synovitis on examination today.  He continues to experience morning stiffness in both hands lasting 1 to 2 hours daily.  On exam he has some tenderness and stiffness of the right second and third PIP joints but no synovitis was noted.  He is not  experiencing other joint pain or joint swelling at this time.  He has noticed about a 25% improvement in his joint pain and stiffness since initiating Humira on 10/03/2021.  He is currently on Humira 40 mg subcu as injections every 14 days and methotrexate 6 tablets by mouth once weekly.  He is tolerating combination therapy without any side effects or injection site reactions.  His fatigue is gradually started to improve.  He has had less frequent and less severe joint pain on combination therapy.  He will remain on the current treatment regimen.  We will reassess for the full efficacy in 2 to 3 months or sooner if needed.  High risk medication use - Humira 40 mg every 14 days, methotrexate to 6 tablets once weekly and folic acid 2 mg daily - Plan: COMPLETE METABOLIC PANEL WITH GFR, CBC with Differential/Platelet CBC and CMP were drawn on 09/26/2021.  Orders for CBC and CMP were released today.  His next lab work will be due in 3 months to monitor for drug toxicity.  Standing orders for CBC and CMP remain in place. TB Gold negative on 07/08/2021. No recent or recurrent infections. Discussed the importance of holding Humira and methotrexate if he develops signs or symptoms of an infection and to resume once the infection has completely cleared. He is planning on getting the  annual flu shot.  Discussed the importance of having the pneumonia vaccine when eligible as well as the second Shingrix vaccine. He is scheduled for an upcoming dermatology appointment this winter.  Other fatigue: He continues to have chronic fatigue which is gradually started to improve since initiating Humira.  Discussed the importance of good sleep hygiene and regular exercise.  Primary osteoarthritis of both hands: PIP and DIP thickening consistent with osteoarthritis of both hands.  Some tenderness and stiffness of the right second and third PIP joints.  Complete fist formation noted bilaterally.  Discussed the importance of joint protection and muscle strengthening.  Chronic pain of both knees - XR unremarkable.  He has good ROM of both knee joints with no discomfort.  No warmth or effusion noted.   Primary osteoarthritis of both feet: He is not experiencing any discomfort in his feet at this time.    Other medical conditions are listed as follows:   Neuroma  Seasonal allergic rhinitis due to pollen  History of BPH  Orders: Orders Placed This Encounter  Procedures   COMPLETE METABOLIC PANEL WITH GFR   CBC with Differential/Platelet   No orders of the defined types were placed in this encounter.    Follow-Up Instructions: Return in about 3 months (around 02/15/2022) for Rheumatoid arthritis, Osteoarthritis.   Ofilia Neas, PA-C  Note - This record has been created using Dragon software.  Chart creation errors have been sought, but may not always  have been located. Such creation errors do not reflect on  the standard of medical care.

## 2021-11-15 ENCOUNTER — Encounter: Payer: Self-pay | Admitting: Physician Assistant

## 2021-11-15 ENCOUNTER — Ambulatory Visit: Payer: 59 | Attending: Physician Assistant | Admitting: Physician Assistant

## 2021-11-15 VITALS — BP 134/84 | HR 108 | Resp 15 | Ht 73.0 in | Wt 216.4 lb

## 2021-11-15 DIAGNOSIS — M0579 Rheumatoid arthritis with rheumatoid factor of multiple sites without organ or systems involvement: Secondary | ICD-10-CM

## 2021-11-15 DIAGNOSIS — R5383 Other fatigue: Secondary | ICD-10-CM | POA: Diagnosis not present

## 2021-11-15 DIAGNOSIS — M25561 Pain in right knee: Secondary | ICD-10-CM

## 2021-11-15 DIAGNOSIS — M19041 Primary osteoarthritis, right hand: Secondary | ICD-10-CM | POA: Diagnosis not present

## 2021-11-15 DIAGNOSIS — Z79899 Other long term (current) drug therapy: Secondary | ICD-10-CM

## 2021-11-15 DIAGNOSIS — Z87438 Personal history of other diseases of male genital organs: Secondary | ICD-10-CM

## 2021-11-15 DIAGNOSIS — M19042 Primary osteoarthritis, left hand: Secondary | ICD-10-CM

## 2021-11-15 DIAGNOSIS — J301 Allergic rhinitis due to pollen: Secondary | ICD-10-CM

## 2021-11-15 DIAGNOSIS — M19072 Primary osteoarthritis, left ankle and foot: Secondary | ICD-10-CM

## 2021-11-15 DIAGNOSIS — M19071 Primary osteoarthritis, right ankle and foot: Secondary | ICD-10-CM

## 2021-11-15 DIAGNOSIS — M25562 Pain in left knee: Secondary | ICD-10-CM

## 2021-11-15 DIAGNOSIS — G8929 Other chronic pain: Secondary | ICD-10-CM

## 2021-11-15 DIAGNOSIS — D3610 Benign neoplasm of peripheral nerves and autonomic nervous system, unspecified: Secondary | ICD-10-CM

## 2021-11-15 NOTE — Patient Instructions (Addendum)
Standing Labs We placed an order today for your standing lab work.   Please have your standing labs drawn in early December and every 3 months   If possible, please have your labs drawn 2 weeks prior to your appointment so that the provider can discuss your results at your appointment.  Please note that you may see your imaging and lab results in [place] before we have reviewed them. We may be awaiting multiple results to interpret others before contacting you. Please allow our office up to 72 hours to thoroughly review all of the results before contacting the office for clarification of your results.  We currently have open lab daily: Monday through Thursday from 1:30 PM-4:30 PM and Friday from 1:30 PM- 4:00 PM If possible, please come for your lab work on Monday, Thursday or Friday afternoons, as you may experience shorter wait times.   Effective January 18, 2022 the new lab hours will change to: Monday through Thursday from 1:30 PM-5:00 PM and Friday from 8:30 AM-12:00 PM If possible, please come for your lab work on Monday and Thursday afternoons, as you may experience shorter wait times.  Please be advised, all patients with office appointments requiring lab work will take precedent over walk-in lab work.    The office is located at [address] [place], [place], [place] [address] No appointment is necessary.   Labs are drawn by [facility]. Please bring your co-pay at the time of your lab draw.  You may receive a bill from [facility] for your lab work.  Please note if you are on Hydroxychloroquine and and an order has been placed for a Hydroxychloroquine level, you will need to have it drawn 4 hours or more after your last dose.  If you wish to have your labs drawn at another location, please call the office 24 hours in advance to send orders.  If you have any questions regarding directions or hours of operation,  please call [phone].   As a reminder, please drink plenty of water  prior to coming for your lab work. Thanks!   If you have signs or symptoms of an infection or start antibiotics: First, call your PCP for workup of your infection. Hold your medication through the infection, until you complete your antibiotics, and until symptoms resolve if you take the following: Injectable medication (Actemra, Benlysta, Cimzia, Cosentyx, Enbrel, Humira, Kevzara, Orencia, Remicade, Simponi, Stelara, Taltz, Tremfya) Methotrexate Leflunomide (Arava) Mycophenolate (Cellcept) Morrie Sheldon, Olumiant, or Rinvoq  Vaccines You are taking a medication(s) that can suppress your immune system.  The following immunizations are recommended: Flu annually Covid-19  Td/Tdap (tetanus, diphtheria, pertussis) every 10 years Pneumonia (Prevnar 15 then Pneumovax 23 at least 1 year apart.  Alternatively, can take Prevnar 20 without needing additional dose) Shingrix: 2 doses from 4 weeks to 6 months apart  Please check with your PCP to make sure you are up to date.

## 2021-11-15 NOTE — Progress Notes (Signed)
CBC WNL

## 2021-11-16 LAB — COMPLETE METABOLIC PANEL WITH GFR
AG Ratio: 1.6 (calc) (ref 1.0–2.5)
ALT: 17 U/L (ref 9–46)
AST: 15 U/L (ref 10–35)
Albumin: 4.4 g/dL (ref 3.6–5.1)
Alkaline phosphatase (APISO): 67 U/L (ref 35–144)
BUN: 11 mg/dL (ref 7–25)
CO2: 26 mmol/L (ref 20–32)
Calcium: 10.3 mg/dL (ref 8.6–10.3)
Chloride: 107 mmol/L (ref 98–110)
Creat: 1.02 mg/dL (ref 0.70–1.30)
Globulin: 2.8 g/dL (calc) (ref 1.9–3.7)
Glucose, Bld: 105 mg/dL — ABNORMAL HIGH (ref 65–99)
Potassium: 4 mmol/L (ref 3.5–5.3)
Sodium: 143 mmol/L (ref 135–146)
Total Bilirubin: 0.5 mg/dL (ref 0.2–1.2)
Total Protein: 7.2 g/dL (ref 6.1–8.1)
eGFR: 85 mL/min/{1.73_m2} (ref >=60)

## 2021-11-16 LAB — CBC WITH DIFFERENTIAL/PLATELET
Absolute Monocytes: 548 cells/uL (ref 200–950)
Basophils Absolute: 59 cells/uL (ref 0–200)
Basophils Relative: 0.9 %
Eosinophils Absolute: 416 cells/uL (ref 15–500)
Eosinophils Relative: 6.3 %
HCT: 44.5 % (ref 38.5–50.0)
Hemoglobin: 16.2 g/dL (ref 13.2–17.1)
Lymphs Abs: 1531 cells/uL (ref 850–3900)
MCH: 33.7 pg — ABNORMAL HIGH (ref 27.0–33.0)
MCHC: 36.4 g/dL — ABNORMAL HIGH (ref 32.0–36.0)
MCV: 92.5 fL (ref 80.0–100.0)
MPV: 10.2 fL (ref 7.5–12.5)
Monocytes Relative: 8.3 %
Neutro Abs: 4046 cells/uL (ref 1500–7800)
Neutrophils Relative %: 61.3 %
Platelets: 214 10*3/uL (ref 140–400)
RBC: 4.81 10*6/uL (ref 4.20–5.80)
RDW: 13.4 % (ref 11.0–15.0)
Total Lymphocyte: 23.2 %
WBC: 6.6 10*3/uL (ref 3.8–10.8)

## 2021-11-16 NOTE — Progress Notes (Signed)
Glucose is 105. Rest of CMP WNL.

## 2021-11-22 ENCOUNTER — Other Ambulatory Visit: Payer: Self-pay | Admitting: Family Medicine

## 2021-11-22 DIAGNOSIS — R0989 Other specified symptoms and signs involving the circulatory and respiratory systems: Secondary | ICD-10-CM

## 2021-11-22 NOTE — Telephone Encounter (Signed)
Chart supports rx. Last OV: 08/30/2021 Next OV: 04/25/2022

## 2021-12-06 ENCOUNTER — Other Ambulatory Visit: Payer: Self-pay | Admitting: Physician Assistant

## 2021-12-06 DIAGNOSIS — Z79899 Other long term (current) drug therapy: Secondary | ICD-10-CM

## 2021-12-06 DIAGNOSIS — M0579 Rheumatoid arthritis with rheumatoid factor of multiple sites without organ or systems involvement: Secondary | ICD-10-CM

## 2021-12-06 NOTE — Telephone Encounter (Signed)
Next Visit: 02/16/2022  Last Visit: 11/15/2021  Last Fill: 10/03/2021  DX: Rheumatoid arthritis involving multiple sites with positive rheumatoid factor   Current Dose per office note 11/15/2021: Humira 40 mg every 14 days  Labs: 11/15/2021 Glucose is 105. Rest of CMP WNL. CBC WNL  TB Gold: 07/08/2021 Neg    Okay to refill Humira?

## 2021-12-24 ENCOUNTER — Other Ambulatory Visit: Payer: Self-pay | Admitting: Physician Assistant

## 2021-12-24 DIAGNOSIS — Z79899 Other long term (current) drug therapy: Secondary | ICD-10-CM

## 2021-12-24 DIAGNOSIS — M0579 Rheumatoid arthritis with rheumatoid factor of multiple sites without organ or systems involvement: Secondary | ICD-10-CM

## 2021-12-26 NOTE — Telephone Encounter (Signed)
Next Visit: 02/16/2022   Last Visit: 11/15/2021   Last Fill: 10/03/2021  DX: Rheumatoid arthritis involving multiple sites with positive rheumatoid factor    Current Dose per office note 11/15/2021: methotrexate to 6 tablets once weekly  Labs: 11/15/2021 Glucose is 105. Rest of CMP WNL. CBC WNL  Okay to refill MTX?

## 2021-12-28 ENCOUNTER — Ambulatory Visit: Payer: 59 | Admitting: Rheumatology

## 2022-02-02 NOTE — Progress Notes (Unsigned)
Office Visit Note  Patient: Thomas Davidson             Date of Birth: [birth year]           MRN: [id number]             PCP: Charyl Dancer, NP Referring: Charyl Dancer, NP Visit Date: 02/16/2022 Occupation: '@GUAROCC'$ @  Subjective:  Medication monitoring   History of Present Illness: Thomas Davidson is a 61 y.o. male with history of seropositive rheumatoid arthritis and osteoarthritis.  Patient is currently prescribed Humira 40 mg every 14 days, methotrexate to 6 tablets once weekly and folic acid 2 mg daily.  He has been tolerating combination therapy but is currently been holding both medications for the past 3 weeks due to being treated for sinusitis.  He was initially treated with Augmentin which she completed on Sunday but continued to have persistent symptoms and was prescribed doxycycline and a prednisone taper on Tuesday.  His symptoms have started to improve.  He plans on continuing to hold Humira and methotrexate until his symptoms have completely resolved.  He denies any signs or symptoms of a flare while holding his medications.  He has not experienced any morning stiffness or nocturnal pain.  He denies any difficulty with ADLs.  He denies any other recurrent infections.  He denies any new medical conditions. He states that he has a dermatology appointment scheduled on 03/21/22.    Activities of Daily Living:  Patient reports morning stiffness for 0 minutes.   Patient Denies nocturnal pain.  Difficulty dressing/grooming: Denies Difficulty climbing stairs: Denies Difficulty getting out of chair: Denies Difficulty using hands for taps, buttons, cutlery, and/or writing: Denies  Review of Systems  Constitutional:  Negative for fatigue.  HENT:  Negative for mouth sores and mouth dryness.   Eyes:  Negative for dryness.  Respiratory:  Positive for shortness of breath.   Cardiovascular:  Negative for chest pain and palpitations.  Gastrointestinal:  Negative for blood in stool,  constipation and diarrhea.  Endocrine: Negative for increased urination.  Genitourinary:  Negative for involuntary urination.  Musculoskeletal:  Negative for joint pain, gait problem, joint pain, joint swelling, myalgias, muscle weakness, morning stiffness, muscle tenderness and myalgias.  Skin:  Negative for color change, rash and sensitivity to sunlight.  Allergic/Immunologic: Positive for susceptible to infections.  Neurological:  Negative for dizziness and headaches.  Hematological:  Negative for swollen glands.  Psychiatric/Behavioral:  Negative for depressed mood and sleep disturbance. The patient is not nervous/anxious.     PMFS History:  Patient Active Problem List   Diagnosis Date Noted   Arthralgia of both hands 06/29/2021   Pain in both upper arms 04/25/2021   Elevated blood pressure reading 04/25/2021   History of BPH 06/12/2016   Neuroma 04/05/2015   Allergic rhinitis 11/19/2008    Past Medical History:  Diagnosis Date   Allergy    Asthma    as child   BPH (benign prostatic hyperplasia)    Rheumatoid arthritis ([place])     Family History  Problem Relation Age of Onset   Healthy Mother    Heart disease Father    Diabetes Maternal Grandfather    Healthy Son    Healthy Daughter    Healthy Daughter    Colon cancer Neg Hx    Past Surgical History:  Procedure Laterality Date   NO PAST SURGERIES     Social History   Social History Narrative    Married, 3  children, 6 grandchildren. Works as a Librarian, academic at [employer].      Immunization History  Administered Date(s) Administered   Influenza Split 02/05/2012   Influenza Whole 12/19/2006, 12/18/2008   Influenza,inj,Quad PF,6+ Mos 02/05/2013, 01/26/2014   Influenza-Unspecified 10/19/2014, 01/23/2021   Td 10/20/2002   Tdap 02/05/2013   Zoster Recombinat (Shingrix) 04/25/2021     Objective: Vital Signs: BP (!) 132/90 (BP Location: Left Arm, Patient Position: Sitting, Cuff Size: Normal)   Pulse 92   Resp 18    Ht '6\' 1"'$  (1.854 m)   Wt 216 lb 9.6 oz (98.2 kg)   BMI 28.58 kg/m    Physical Exam Vitals and nursing note reviewed.  Constitutional:      Appearance: He is well-developed.  HENT:     Head: Normocephalic and atraumatic.  Eyes:     Conjunctiva/sclera: Conjunctivae normal.     Pupils: Pupils are equal, round, and reactive to light.  Cardiovascular:     Rate and Rhythm: Normal rate and regular rhythm.     Heart sounds: Normal heart sounds.  Pulmonary:     Effort: Pulmonary effort is normal.     Breath sounds: Normal breath sounds.  Abdominal:     General: Bowel sounds are normal.     Palpations: Abdomen is soft.  Musculoskeletal:     Cervical back: Normal range of motion and neck supple.  Skin:    General: Skin is warm and dry.     Capillary Refill: Capillary refill takes less than 2 seconds.  Neurological:     Mental Status: He is alert and oriented to person, place, and time.  Psychiatric:        Behavior: Behavior normal.      Musculoskeletal Exam: C-spine, thoracic spine, lumbar spine have good range of motion.  No midline spinal tenderness.  No SI joint tenderness.  Shoulder joints and elbow joints have good range of motion.  Slightly limited extension of both wrist joints but no tenderness or synovitis.  No tenderness or synovitis over MCP or PIP joints.  Complete fist formation bilaterally.  PIP and DIP thickening consistent with osteoarthritis of both hands.  Hip joints have good range of motion with no groin pain.  Knee joints have good range of motion with no warmth or effusion.  No tenderness over ankle joints.  CDAI Exam: CDAI Score: -- Patient Global: 1 mm; Provider Global: 1 mm Swollen: --; Tender: -- Joint Exam 02/16/2022   No joint exam has been documented for this visit   There is currently no information documented on the homunculus. Go to the Rheumatology activity and complete the homunculus joint exam.  Investigation: No additional  findings.  Imaging: No results found.  Recent Labs: Lab Results  Component Value Date   WBC 6.6 11/15/2021   HGB 16.2 11/15/2021   PLT 214 11/15/2021   NA 143 11/15/2021   K 4.0 11/15/2021   CL 107 11/15/2021   CO2 26 11/15/2021   GLUCOSE 105 (H) 11/15/2021   BUN 11 11/15/2021   CREATININE 1.02 11/15/2021   BILITOT 0.5 11/15/2021   ALKPHOS 80 04/25/2021   AST 15 11/15/2021   ALT 17 11/15/2021   PROT 7.2 11/15/2021   ALBUMIN 4.0 04/25/2021   CALCIUM 10.3 11/15/2021   GFRAA 104 01/03/2007   QFTBGOLDPLUS NEGATIVE 07/08/2021    Speciality Comments: MTX 06/2021  Procedures:  No procedures performed Allergies: Bee venom  . Assessment / Plan:     Visit Diagnoses: Rheumatoid arthritis involving  multiple sites with positive rheumatoid factor (HCC) - +RF, +CCP Ab, ANA-, synovitis noted in multiple joints: He has no joint tenderness or synovitis on examination today.  He has not had any signs or symptoms of a rheumatoid arthritis flare.  He has clinically been doing well on Humira 40 mg subcu as injections every 14 days and methotrexate 6 tablets by mouth once weekly.  He is tolerating combination therapy without any side effects.  He has been holding both Humira and methotrexate for the past 3 weeks due to being treated for sinusitis.  He was initially treated with a course of Augmentin which she completed Sunday but has had persistent symptoms and was initiated on doxycycline on Tuesday.  He was also given a prednisone taper which has helped to alleviate his symptoms.  He plans to continue to hold Humira and methotrexate until the infection has completely resolved. He has not noticed any increased joint pain or joint swelling during the In therapy.  No medication changes will be made at this time.  He was advised to notify us if he develops signs or symptoms of a flare.  He will follow-up in the office in 4 months or sooner if needed.  High risk medication use - Humira 40 mg every 14  days, methotrexate to 6 tablets once weekly and folic acid 2 mg daily  CBC and CMP were drawn on 11/15/2021.  Orders for CBC and CMP were released today.  His next lab work will be due in early March and every 3 months to monitor for drug toxicity. TB Gold negative on 07/08/2021. Discussed the importance of holding Humira and methotrexate if he develops signs or symptoms of infection and to resume once the infection has completely cleared. He is scheduled for yearly skin cancer screening with dermatology on 03/21/2022. - Plan: CBC with Differential/Platelet, COMPLETE METABOLIC PANEL WITH GFR  Other fatigue: Stable.   Primary osteoarthritis of both hands: No tenderness or inflammation noted on examination today.  PIP and DIP thickening consistent with osteoarthritis of both hands.  Complete fist formation bilaterally.  Chronic pain of both knees - XR unremarkable.  No warmth or effusion noted.  Primary osteoarthritis of both feet: Ankle joints have good range of motion with no tenderness or joint swelling.  Other medical conditions are listed as follows:  Neuroma  Seasonal allergic rhinitis due to pollen  History of BPH  Orders: Orders Placed This Encounter  Procedures   CBC with Differential/Platelet   COMPLETE METABOLIC PANEL WITH GFR   No orders of the defined types were placed in this encounter.     Follow-Up Instructions: Return in about 4 months (around 06/17/2022) for Rheumatoid arthritis, Osteoarthritis.   Ofilia Neas, PA-C  Note - This record has been created using Dragon software.  Chart creation errors have been sought, but may not always  have been located. Such creation errors do not reflect on  the standard of medical care.

## 2022-02-06 ENCOUNTER — Encounter: Payer: Self-pay | Admitting: Nurse Practitioner

## 2022-02-06 ENCOUNTER — Telehealth: Payer: 59 | Admitting: Family Medicine

## 2022-02-06 DIAGNOSIS — J019 Acute sinusitis, unspecified: Secondary | ICD-10-CM | POA: Diagnosis not present

## 2022-02-06 DIAGNOSIS — B9689 Other specified bacterial agents as the cause of diseases classified elsewhere: Secondary | ICD-10-CM | POA: Diagnosis not present

## 2022-02-06 MED ORDER — BENZONATATE 100 MG PO CAPS: 100.0000 mg | ORAL_CAPSULE | Freq: Three times a day (TID) | ORAL | 0 refills | Status: AC | PRN

## 2022-02-06 MED ORDER — AMOXICILLIN-POT CLAVULANATE 875-125 MG PO TABS: 1.0000 | ORAL_TABLET | Freq: Two times a day (BID) | ORAL | 0 refills | Status: AC

## 2022-02-06 NOTE — Progress Notes (Signed)

## 2022-02-13 ENCOUNTER — Other Ambulatory Visit: Payer: Self-pay | Admitting: Rheumatology

## 2022-02-13 DIAGNOSIS — Z79899 Other long term (current) drug therapy: Secondary | ICD-10-CM

## 2022-02-13 DIAGNOSIS — M0579 Rheumatoid arthritis with rheumatoid factor of multiple sites without organ or systems involvement: Secondary | ICD-10-CM

## 2022-02-13 NOTE — Telephone Encounter (Signed)
Next Visit: 02/16/2022   Last Visit: 11/15/2021   Last Fill: 12/06/2021   DX: Rheumatoid arthritis involving multiple sites with positive rheumatoid factor    Current Dose per office note 11/15/2021: Humira 40 mg every 14 days   Labs: 11/15/2021 Glucose is 105. Rest of CMP WNL. CBC WNL   TB Gold: 07/08/2021 Neg     Okay to refill Humira?

## 2022-02-14 ENCOUNTER — Encounter: Payer: Self-pay | Admitting: Nurse Practitioner

## 2022-02-14 ENCOUNTER — Telehealth (INDEPENDENT_AMBULATORY_CARE_PROVIDER_SITE_OTHER): Payer: 59 | Admitting: Nurse Practitioner

## 2022-02-14 VITALS — Ht 73.0 in | Wt 216.6 lb

## 2022-02-14 DIAGNOSIS — J0100 Acute maxillary sinusitis, unspecified: Secondary | ICD-10-CM

## 2022-02-14 MED ORDER — PREDNISONE 10 MG PO TABS: ORAL_TABLET | ORAL | 0 refills | Status: DC

## 2022-02-14 MED ORDER — DOXYCYCLINE HYCLATE 100 MG PO TABS: 100.0000 mg | ORAL_TABLET | Freq: Two times a day (BID) | ORAL | 0 refills | Status: DC

## 2022-02-14 NOTE — Patient Instructions (Signed)
It was great to see you!  Start doxycycline 1 tablet twice a day with food. Start prednisone taper 6 tablets today, 5 tomorrow, 4 the next day, and keep decreasing by 1 every day until gone. Take this in the morning with food.   Make sure you are drinking plenty of fluids. Keep doing the neti pot.   Let's follow-up if your symptoms worsen or don't improve.   Take care,  Vance Peper, NP

## 2022-02-14 NOTE — Progress Notes (Signed)
[facility] [address] [place] [address] Dept: [phone] Dept Fax: [phone]  Virtual Video Visit  I connected with Teena Irani on 02/14/22 at  9:20 AM EST by a video enabled telemedicine application and verified that I am speaking with the correct person using two identifiers.  Location patient: Home Location provider: Clinic Persons participating in the virtual visit: Patient; Vance Peper, NP; Beatrice Lecher, CMA  I discussed the limitations of evaluation and management by telemedicine and the availability of in person appointments. The patient expressed understanding and agreed to proceed.  Chief Complaint  Patient presents with   Nasal Congestion    Pt c/o nasal congestion coughing x 3 weeks cold and flu OTC meds pearl tablets amoxicillin benzonatate .     SUBJECTIVE:  HPI: Thomas Davidson is a 61 y.o. male who presents with nasal congestion, cough, and sinus pain for the last 3 weeks. He did an urgent care visit and was prescribed augmentin. He finished this a few days ago and is still not feeling any better.  UPPER RESPIRATORY TRACT INFECTION  Fever: no Cough: yes Shortness of breath: no Wheezing: no Chest pain: no Chest tightness: no Chest congestion: no Nasal congestion: yes Runny nose: yes Post nasal drip: no Sneezing: no Sore throat: no Swollen glands: no Sinus pressure: yes Headache: no Face pain: no Toothache: no Ear pain: no bilateral Ear pressure: yes bilateral Eyes red/itching:no Eye drainage/crusting:  stopped a few days ago   Vomiting: no Rash: no Fatigue: yes Sick contacts: no Strep contacts: no  Context: stable Recurrent sinusitis: no Relief with OTC cold/cough medications: no  Treatments attempted:  neti pot nasal rinse     Patient Active Problem List   Diagnosis Date Noted   Arthralgia of both hands 06/29/2021   Pain in both upper arms 04/25/2021   Elevated blood  pressure reading 04/25/2021   History of BPH 06/12/2016   Neuroma 04/05/2015   Allergic rhinitis 11/19/2008    Past Surgical History:  Procedure Laterality Date   NO PAST SURGERIES      Family History  Problem Relation Age of Onset   Healthy Mother    Heart disease Father    Diabetes Maternal Grandfather    Healthy Son    Healthy Daughter    Healthy Daughter    Colon cancer Neg Hx     Social History   Tobacco Use   Smoking status: Never    Passive exposure: Never   Smokeless tobacco: Current    Types: Chew  Vaping Use   Vaping Use: Never used  Substance Use Topics   Alcohol use: No   Drug use: No     Current Outpatient Medications:    aspirin 81 MG EC tablet, Take 81 mg by mouth daily. Swallow whole., Disp: , Rfl:    benzonatate (TESSALON) 100 MG capsule, Take 1 capsule (100 mg total) by mouth 3 (three) times daily as needed for cough., Disp: 30 capsule, Rfl: 0   cetirizine (ZYRTEC) 10 MG tablet, Take 10 mg by mouth daily., Disp: , Rfl:    doxycycline (VIBRA-TABS) 100 MG tablet, Take 1 tablet (100 mg total) by mouth 2 (two) times daily., Disp: 20 tablet, Rfl: 0   fluticasone (FLONASE) 50 MCG/ACT nasal spray, Place 2 sprays into both nostrils daily., Disp: 16 g, Rfl: 6   folic acid (FOLVITE) 1 MG tablet, Take 2 tablets (2 mg total) by mouth daily., Disp: 180 tablet, Rfl:  3   HUMIRA PEN 40 MG/0.4ML PNKT, INJECT 1 PEN UNDER THE SKIN EVERY 14 DAYS., Disp: 6 each, Rfl: 0   methotrexate (RHEUMATREX) 2.5 MG tablet, TAKE 6 TABLETS (15 MG TOTAL) BY MOUTH ONCE A WEEK. CAUTION:CHEMOTHERAPY. PROTECT FROM LIGHT., Disp: 72 tablet, Rfl: 0   montelukast (SINGULAIR) 10 MG tablet, TAKE 1 TABLET BY MOUTH EVERYDAY AT BEDTIME, Disp: 90 tablet, Rfl: 2   predniSONE (DELTASONE) 10 MG tablet, Take 6 tablets today, then 5 tablets tomorrow, then decrease by 1 tablet every day until gone with food, Disp: 21 tablet, Rfl: 0   tamsulosin (FLOMAX) 0.4 MG CAPS capsule, Take 1 capsule (0.4 mg total) by  mouth daily., Disp: 90 capsule, Rfl: 3  Allergies  Allergen Reactions   Bee Venom Anaphylaxis and Hives    ROS: See pertinent positives and negatives per HPI.  OBSERVATIONS/OBJECTIVE:  VITALS per patient if applicable: Today's Vitals   02/14/22 0932  Weight: 216 lb 9.6 oz (98.2 kg)  Height: '6\' 1"'$  (1.854 m)   Body mass index is 28.58 kg/m.    GENERAL: Alert and oriented. Appears well and in no acute distress.  HEENT: Atraumatic. Conjunctiva clear. No obvious abnormalities on inspection of external nose and ears.  NECK: Normal movements of the head and neck.  LUNGS: On inspection, no signs of respiratory distress. Breathing rate appears normal. No obvious gross SOB, gasping or wheezing, and no conversational dyspnea.  CV: No obvious cyanosis.  MS: Moves all visible extremities without noticeable abnormality.  PSYCH/NEURO: Pleasant and cooperative. No obvious depression or anxiety. Speech and thought processing grossly intact.  ASSESSMENT AND PLAN:  Problem List Items Addressed This Visit   None Visit Diagnoses     Acute non-recurrent maxillary sinusitis    -  Primary   Symptoms x3 weeks, was treated with augmentin with no improvement. Will treat with doxycycline BID x10 days and prednisone taper. F/U if not improving   Relevant Medications   doxycycline (VIBRA-TABS) 100 MG tablet   predniSONE (DELTASONE) 10 MG tablet        I discussed the assessment and treatment plan with the patient. The patient was provided an opportunity to ask questions and all were answered. The patient agreed with the plan and demonstrated an understanding of the instructions.   The patient was advised to call back or seek an in-person evaluation if the symptoms worsen or if the condition fails to improve as anticipated.   Charyl Dancer, NP

## 2022-02-16 ENCOUNTER — Encounter: Payer: Self-pay | Admitting: Physician Assistant

## 2022-02-16 ENCOUNTER — Ambulatory Visit: Payer: 59 | Attending: Physician Assistant | Admitting: Physician Assistant

## 2022-02-16 VITALS — BP 132/90 | HR 92 | Resp 18 | Ht 73.0 in | Wt 216.6 lb

## 2022-02-16 DIAGNOSIS — R5383 Other fatigue: Secondary | ICD-10-CM | POA: Diagnosis not present

## 2022-02-16 DIAGNOSIS — M25562 Pain in left knee: Secondary | ICD-10-CM

## 2022-02-16 DIAGNOSIS — G8929 Other chronic pain: Secondary | ICD-10-CM

## 2022-02-16 DIAGNOSIS — Z79899 Other long term (current) drug therapy: Secondary | ICD-10-CM | POA: Diagnosis not present

## 2022-02-16 DIAGNOSIS — M19071 Primary osteoarthritis, right ankle and foot: Secondary | ICD-10-CM

## 2022-02-16 DIAGNOSIS — M19041 Primary osteoarthritis, right hand: Secondary | ICD-10-CM

## 2022-02-16 DIAGNOSIS — M19072 Primary osteoarthritis, left ankle and foot: Secondary | ICD-10-CM

## 2022-02-16 DIAGNOSIS — D3610 Benign neoplasm of peripheral nerves and autonomic nervous system, unspecified: Secondary | ICD-10-CM

## 2022-02-16 DIAGNOSIS — M0579 Rheumatoid arthritis with rheumatoid factor of multiple sites without organ or systems involvement: Secondary | ICD-10-CM

## 2022-02-16 DIAGNOSIS — M19042 Primary osteoarthritis, left hand: Secondary | ICD-10-CM

## 2022-02-16 DIAGNOSIS — Z87438 Personal history of other diseases of male genital organs: Secondary | ICD-10-CM

## 2022-02-16 DIAGNOSIS — M25561 Pain in right knee: Secondary | ICD-10-CM

## 2022-02-16 DIAGNOSIS — J301 Allergic rhinitis due to pollen: Secondary | ICD-10-CM

## 2022-02-17 LAB — CBC WITH DIFFERENTIAL/PLATELET
Absolute Monocytes: 1201 cells/uL — ABNORMAL HIGH (ref 200–950)
Basophils Absolute: 47 cells/uL (ref 0–200)
Basophils Relative: 0.3 %
Eosinophils Absolute: 63 cells/uL (ref 15–500)
Eosinophils Relative: 0.4 %
HCT: 45.6 % (ref 38.5–50.0)
Hemoglobin: 15.9 g/dL (ref 13.2–17.1)
Lymphs Abs: 2607 cells/uL (ref 850–3900)
MCH: 32 pg (ref 27.0–33.0)
MCHC: 34.9 g/dL (ref 32.0–36.0)
MCV: 91.8 fL (ref 80.0–100.0)
MPV: 9.5 fL (ref 7.5–12.5)
Monocytes Relative: 7.6 %
Neutro Abs: 11882 cells/uL — ABNORMAL HIGH (ref 1500–7800)
Neutrophils Relative %: 75.2 %
Platelets: 271 10*3/uL (ref 140–400)
RBC: 4.97 10*6/uL (ref 4.20–5.80)
RDW: 12.7 % (ref 11.0–15.0)
Total Lymphocyte: 16.5 %
WBC: 15.8 10*3/uL — ABNORMAL HIGH (ref 3.8–10.8)

## 2022-02-17 LAB — COMPLETE METABOLIC PANEL WITH GFR
AG Ratio: 1.2 (calc) (ref 1.0–2.5)
ALT: 20 U/L (ref 9–46)
AST: 14 U/L (ref 10–35)
Albumin: 4.1 g/dL (ref 3.6–5.1)
Alkaline phosphatase (APISO): 84 U/L (ref 35–144)
BUN: 13 mg/dL (ref 7–25)
CO2: 25 mmol/L (ref 20–32)
Calcium: 9.7 mg/dL (ref 8.6–10.3)
Chloride: 105 mmol/L (ref 98–110)
Creat: 0.97 mg/dL (ref 0.70–1.30)
Globulin: 3.5 g/dL (calc) (ref 1.9–3.7)
Glucose, Bld: 98 mg/dL (ref 65–99)
Potassium: 3.7 mmol/L (ref 3.5–5.3)
Sodium: 141 mmol/L (ref 135–146)
Total Bilirubin: 0.4 mg/dL (ref 0.2–1.2)
Total Protein: 7.6 g/dL (ref 6.1–8.1)
eGFR: 90 mL/min/{1.73_m2} (ref >=60)

## 2022-02-17 NOTE — Progress Notes (Signed)
CMP WNL.  WBC count is slightly elevated-absolute neutrophils and monocytes are elevated.  Patient is currently on a course of doxycyline and prednisone for management of sinusitis.

## 2022-04-25 ENCOUNTER — Encounter: Payer: 59 | Admitting: Nurse Practitioner

## 2022-04-27 ENCOUNTER — Ambulatory Visit (INDEPENDENT_AMBULATORY_CARE_PROVIDER_SITE_OTHER): Payer: 59 | Admitting: Nurse Practitioner

## 2022-04-27 ENCOUNTER — Encounter: Payer: Self-pay | Admitting: Nurse Practitioner

## 2022-04-27 VITALS — BP 118/80 | HR 88 | Temp 97.9°F | Ht 73.0 in | Wt 219.4 lb

## 2022-04-27 DIAGNOSIS — J301 Allergic rhinitis due to pollen: Secondary | ICD-10-CM | POA: Diagnosis not present

## 2022-04-27 DIAGNOSIS — Z87438 Personal history of other diseases of male genital organs: Secondary | ICD-10-CM

## 2022-04-27 DIAGNOSIS — M05741 Rheumatoid arthritis with rheumatoid factor of right hand without organ or systems involvement: Secondary | ICD-10-CM | POA: Diagnosis not present

## 2022-04-27 DIAGNOSIS — Z0000 Encounter for general adult medical examination without abnormal findings: Secondary | ICD-10-CM

## 2022-04-27 DIAGNOSIS — M05742 Rheumatoid arthritis with rheumatoid factor of left hand without organ or systems involvement: Secondary | ICD-10-CM

## 2022-04-27 LAB — CBC WITH DIFFERENTIAL/PLATELET
Basophils Absolute: 0 10*3/uL (ref 0.0–0.1)
Basophils Relative: 0.7 % (ref 0.0–3.0)
Eosinophils Absolute: 0.4 10*3/uL (ref 0.0–0.7)
Eosinophils Relative: 5.3 % — ABNORMAL HIGH (ref 0.0–5.0)
HCT: 44.6 % (ref 39.0–52.0)
Hemoglobin: 15.7 g/dL (ref 13.0–17.0)
Lymphocytes Relative: 24.5 % (ref 12.0–46.0)
Lymphs Abs: 1.7 10*3/uL (ref 0.7–4.0)
MCHC: 35.1 g/dL (ref 30.0–36.0)
MCV: 92.9 fl (ref 78.0–100.0)
Monocytes Absolute: 0.7 10*3/uL (ref 0.1–1.0)
Monocytes Relative: 10.7 % (ref 3.0–12.0)
Neutro Abs: 4 10*3/uL (ref 1.4–7.7)
Neutrophils Relative %: 58.8 % (ref 43.0–77.0)
Platelets: 210 10*3/uL (ref 150.0–400.0)
RBC: 4.8 Mil/uL (ref 4.22–5.81)
RDW: 14.4 % (ref 11.5–15.5)
WBC: 6.7 10*3/uL (ref 4.0–10.5)

## 2022-04-27 LAB — COMPREHENSIVE METABOLIC PANEL
ALT: 24 U/L (ref 0–53)
AST: 15 U/L (ref 0–37)
Albumin: 4 g/dL (ref 3.5–5.2)
Alkaline Phosphatase: 75 U/L (ref 39–117)
BUN: 12 mg/dL (ref 6–23)
CO2: 26 mEq/L (ref 19–32)
Calcium: 9.2 mg/dL (ref 8.4–10.5)
Chloride: 105 mEq/L (ref 96–112)
Creatinine, Ser: 0.94 mg/dL (ref 0.40–1.50)
GFR: 88.37 mL/min (ref >60.00)
Glucose, Bld: 93 mg/dL (ref 70–99)
Potassium: 4 mEq/L (ref 3.5–5.1)
Sodium: 140 mEq/L (ref 135–145)
Total Bilirubin: 0.7 mg/dL (ref 0.2–1.2)
Total Protein: 6.8 g/dL (ref 6.0–8.3)

## 2022-04-27 LAB — PSA: PSA: 0.55 ng/mL (ref 0.10–4.00)

## 2022-04-27 NOTE — Assessment & Plan Note (Signed)
Check PSA today and continue flomax 0.'4mg'$  daily.

## 2022-04-27 NOTE — Patient Instructions (Signed)
It was great to see you!  We are checking your labs today and will let you know the results via mychart/phone.   Let me know if you need any refills.   Let's follow-up in 1 year, sooner if you have concerns.  If a referral was placed today, you will be contacted for an appointment. Please note that routine referrals can sometimes take up to 3-4 weeks to process. Please call our office if you haven't heard anything after this time frame.  Take care,  Vance Peper, NP

## 2022-04-27 NOTE — Assessment & Plan Note (Signed)
Chronic, stable. Continue methotrexate '15mg'$  weekly and humira injection '40mg'$  every 2 weeks. Continue collaboration and recommendations from rheumatology.

## 2022-04-27 NOTE — Assessment & Plan Note (Signed)
Chronic, stable. Continue zyrtec and singulair '10mg'$  daily.

## 2022-04-27 NOTE — Progress Notes (Signed)
BP 118/80 (BP Location: Left Arm)   Pulse 88   Temp 97.9 F (36.6 C)   Ht '6\' 1"'$  (1.854 m)   Wt 219 lb 6.4 oz (99.5 kg)   SpO2 99%   BMI 28.95 kg/m    Subjective:    Patient ID: Thomas Davidson, male    DOB: 12-Sep-1960, 62 y.o.   MRN: [id number]  CC: Chief Complaint  Patient presents with   Annual Exam    Fasting labs, no concerns    HPI: Thomas Davidson is a 62 y.o. male presenting on 04/27/2022 for comprehensive medical examination. Current medical complaints include:none  He currently lives with: wife Interim Problems from his last visit: no  Depression and Anxiety Screen done today and results listed below:     04/27/2022    9:03 AM 04/27/2022    8:45 AM 04/25/2021    9:05 AM 06/28/2017    9:26 AM 06/12/2016    9:37 AM  Depression screen PHQ 2/9  Decreased Interest 0 0 0 0 0  Down, Depressed, Hopeless 0 0 0 0 0  PHQ - 2 Score 0 0 0 0 0  Altered sleeping 0      Tired, decreased energy 0      Change in appetite 0      Feeling bad or failure about yourself  0      Trouble concentrating 0      Moving slowly or fidgety/restless 0      Suicidal thoughts 0      PHQ-9 Score 0      Difficult doing work/chores Not difficult at all          04/27/2022    9:03 AM  GAD 7 : Generalized Anxiety Score  Nervous, Anxious, on Edge 0  Control/stop worrying 0  Worry too much - different things 0  Trouble relaxing 0  Restless 0  Easily annoyed or irritable 0  Afraid - awful might happen 0  Total GAD 7 Score 0  Anxiety Difficulty Not difficult at all    The patient does not have a history of falls. I did not complete a risk assessment for falls. A plan of care for falls was not documented.   Past Medical History:  Past Medical History:  Diagnosis Date   Allergy    Asthma    as child   BPH (benign prostatic hyperplasia)    Rheumatoid arthritis ([place])     Surgical History:  Past Surgical History:  Procedure Laterality Date   NO PAST SURGERIES      Medications:  Current  Outpatient Medications on File Prior to Visit  Medication Sig   aspirin 81 MG EC tablet Take 81 mg by mouth daily. Swallow whole.   benzonatate (TESSALON) 100 MG capsule Take 1 capsule (100 mg total) by mouth 3 (three) times daily as needed for cough.   cetirizine (ZYRTEC) 10 MG tablet Take 10 mg by mouth daily.   fluticasone (FLONASE) 50 MCG/ACT nasal spray Place 2 sprays into both nostrils daily.   folic acid (FOLVITE) 1 MG tablet Take 2 tablets (2 mg total) by mouth daily.   HUMIRA PEN 40 MG/0.4ML PNKT INJECT 1 PEN UNDER THE SKIN EVERY 14 DAYS.   methotrexate (RHEUMATREX) 2.5 MG tablet TAKE 6 TABLETS (15 MG TOTAL) BY MOUTH ONCE A WEEK. CAUTION:CHEMOTHERAPY. PROTECT FROM LIGHT.   montelukast (SINGULAIR) 10 MG tablet TAKE 1 TABLET BY MOUTH EVERYDAY AT BEDTIME   tamsulosin (FLOMAX) 0.4 MG CAPS  capsule Take 1 capsule (0.4 mg total) by mouth daily.   UNABLE TO FIND Med Name: Fluorouracil 5% Calcipotriene 0.005% Cream   No current facility-administered medications on file prior to visit.    Allergies:  Allergies  Allergen Reactions   Bee Venom Anaphylaxis and Hives    Social History:  Social History   Socioeconomic History   Marital status: Married    Spouse name: Not on file   Number of children: Not on file   Years of education: Not on file   Highest education level: Not on file  Occupational History   Not on file  Tobacco Use   Smoking status: Never    Passive exposure: Never   Smokeless tobacco: Current    Types: Chew  Vaping Use   Vaping Use: Never used  Substance and Sexual Activity   Alcohol use: No   Drug use: No   Sexual activity: Not on file  Other Topics Concern   Not on file  Social History Narrative    Married, 3 children, 6 grandchildren. Works as a Librarian, academic at [employer].      Social Determinants of Health   Financial Resource Strain: Not on file  Food Insecurity: Not on file  Transportation Needs: Not on file  Physical Activity: Not on file   Stress: Not on file  Social Connections: Not on file  Intimate Partner Violence: Not on file   Social History   Tobacco Use  Smoking Status Never   Passive exposure: Never  Smokeless Tobacco Current   Types: Chew   Social History   Substance and Sexual Activity  Alcohol Use No    Family History:  Family History  Problem Relation Age of Onset   Healthy Mother    Heart disease Father    Diabetes Maternal Grandfather    Healthy Son    Healthy Daughter    Healthy Daughter    Colon cancer Neg Hx     Past medical history, surgical history, medications, allergies, family history and social history reviewed with patient today and changes made to appropriate areas of the chart.   Review of Systems  Constitutional: Negative.   HENT: Negative.    Eyes: Negative.   Respiratory: Negative.    Cardiovascular: Negative.   Gastrointestinal: Negative.   Genitourinary: Negative.   Musculoskeletal: Negative.   Skin:        Redness/scabbing to areas he used Fluorouracil cream on  Neurological: Negative.   Psychiatric/Behavioral: Negative.     All other ROS negative except what is listed above and in the HPI.      Objective:    BP 118/80 (BP Location: Left Arm)   Pulse 88   Temp 97.9 F (36.6 C)   Ht '6\' 1"'$  (1.854 m)   Wt 219 lb 6.4 oz (99.5 kg)   SpO2 99%   BMI 28.95 kg/m   Wt Readings from Last 3 Encounters:  04/27/22 219 lb 6.4 oz (99.5 kg)  02/16/22 216 lb 9.6 oz (98.2 kg)  02/14/22 216 lb 9.6 oz (98.2 kg)    Physical Exam Vitals and nursing note reviewed.  Constitutional:      General: He is not in acute distress.    Appearance: Normal appearance.  HENT:     Head: Normocephalic and atraumatic.     Right Ear: Tympanic membrane, ear canal and external ear normal.     Left Ear: Tympanic membrane, ear canal and external ear normal.  Eyes:  Conjunctiva/sclera: Conjunctivae normal.  Cardiovascular:     Rate and Rhythm: Normal rate and regular rhythm.      Pulses: Normal pulses.     Heart sounds: Normal heart sounds.  Pulmonary:     Effort: Pulmonary effort is normal.     Breath sounds: Normal breath sounds.  Abdominal:     Palpations: Abdomen is soft.     Tenderness: There is no abdominal tenderness.  Musculoskeletal:        General: Normal range of motion.     Cervical back: Normal range of motion and neck supple. No tenderness.     Right lower leg: No edema.     Left lower leg: No edema.  Lymphadenopathy:     Cervical: No cervical adenopathy.  Skin:    General: Skin is warm and dry.     Comments: Scabbed area to right forearm and neck  Neurological:     General: No focal deficit present.     Mental Status: He is alert and oriented to person, place, and time.     Cranial Nerves: No cranial nerve deficit.     Coordination: Coordination normal.     Gait: Gait normal.  Psychiatric:        Mood and Affect: Mood normal.        Behavior: Behavior normal.        Thought Content: Thought content normal.        Judgment: Judgment normal.     Results for orders placed or performed in visit on 02/16/22  CBC with Differential/Platelet  Result Value Ref Range   WBC 15.8 (H) 3.8 - 10.8 Thousand/uL   RBC 4.97 4.20 - 5.80 Million/uL   Hemoglobin 15.9 13.2 - 17.1 g/dL   HCT 45.6 38.5 - 50.0 %   MCV 91.8 80.0 - 100.0 fL   MCH 32.0 27.0 - 33.0 pg   MCHC 34.9 32.0 - 36.0 g/dL   RDW 12.7 11.0 - 15.0 %   Platelets 271 140 - 400 Thousand/uL   MPV 9.5 7.5 - 12.5 fL   Neutro Abs 11,882 (H) 1,500 - 7,800 cells/uL   Lymphs Abs 2,607 850 - 3,900 cells/uL   Absolute Monocytes 1,201 (H) 200 - 950 cells/uL   Eosinophils Absolute 63 15 - 500 cells/uL   Basophils Absolute 47 0 - 200 cells/uL   Neutrophils Relative % 75.2 %   Total Lymphocyte 16.5 %   Monocytes Relative 7.6 %   Eosinophils Relative 0.4 %   Basophils Relative 0.3 %  COMPLETE METABOLIC PANEL WITH GFR  Result Value Ref Range   Glucose, Bld 98 65 - 99 mg/dL   BUN 13 7 - 25 mg/dL    Creat 0.97 0.70 - 1.30 mg/dL   eGFR 90 > OR = 60 mL/min/1.34m   BUN/Creatinine Ratio SEE NOTE: 6 - 22 (calc)   Sodium 141 135 - 146 mmol/L   Potassium 3.7 3.5 - 5.3 mmol/L   Chloride 105 98 - 110 mmol/L   CO2 25 20 - 32 mmol/L   Calcium 9.7 8.6 - 10.3 mg/dL   Total Protein 7.6 6.1 - 8.1 g/dL   Albumin 4.1 3.6 - 5.1 g/dL   Globulin 3.5 1.9 - 3.7 g/dL (calc)   AG Ratio 1.2 1.0 - 2.5 (calc)   Total Bilirubin 0.4 0.2 - 1.2 mg/dL   Alkaline phosphatase (APISO) 84 35 - 144 U/L   AST 14 10 - 35 U/L   ALT 20 9 - 46 U/L  Assessment & Plan:   Problem List Items Addressed This Visit       Respiratory   Allergic rhinitis    Chronic, stable. Continue zyrtec and singulair '10mg'$  daily.         Musculoskeletal and Integument   Rheumatoid arthritis involving both hands with positive rheumatoid factor (HCC)    Chronic, stable. Continue methotrexate '15mg'$  weekly and humira injection '40mg'$  every 2 weeks. Continue collaboration and recommendations from rheumatology.         Other   History of BPH    Check PSA today and continue flomax 0.'4mg'$  daily.       Relevant Orders   PSA   Other Visit Diagnoses     Routine general medical examination at a health care facility    -  Primary   Health maintenance reviewed and updated. Check CMP, CBC, today. Discussed nutrition/exercise. Follow-up 1 year   Relevant Orders   CBC with Differential/Platelet   Comprehensive metabolic panel        Discussed aspirin prophylaxis for myocardial infarction prevention and decision was made to continue ASA  LABORATORY TESTING:  Health maintenance labs ordered today as discussed above.   The natural history of prostate cancer and ongoing controversy regarding screening and potential treatment outcomes of prostate cancer has been discussed with the patient. The meaning of a false positive PSA and a false negative PSA has been discussed. He indicates understanding of the limitations of this screening  test and wishes to proceed with screening PSA testing.   IMMUNIZATIONS:   - Tdap: Tetanus vaccination status reviewed: last tetanus booster within 10 years. - Influenza: Up to date - Pneumovax: Not applicable - Prevnar: Not applicable - HPV: Not applicable - Zostavax vaccine: Refused  SCREENING: - Colonoscopy: Up to date  Discussed with patient purpose of the colonoscopy is to detect colon cancer at curable precancerous or early stages   - AAA Screening: Not applicable  -Hearing Test: Not applicable  -Spirometry: Not applicable   PATIENT COUNSELING:    Sexuality: Discussed sexually transmitted diseases, partner selection, use of condoms, avoidance of unintended pregnancy  and contraceptive alternatives.   Advised to avoid cigarette smoking.  I discussed with the patient that most people either abstain from alcohol or drink within safe limits (<=14/week and <=4 drinks/occasion for males, <=7/weeks and <= 3 drinks/occasion for females) and that the risk for alcohol disorders and other health effects rises proportionally with the number of drinks per week and how often a drinker exceeds daily limits.  Discussed cessation/primary prevention of drug use and availability of treatment for abuse.   Diet: Encouraged to adjust caloric intake to maintain  or achieve ideal body weight, to reduce intake of dietary saturated fat and total fat, to limit sodium intake by avoiding high sodium foods and not adding table salt, and to maintain adequate dietary potassium and calcium preferably from fresh fruits, vegetables, and low-fat dairy products.    stressed the importance of regular exercise  Injury prevention: Discussed safety belts, safety helmets, smoke detector, smoking near bedding or upholstery.   Dental health: Discussed importance of regular tooth brushing, flossing, and dental visits.   Follow up plan: NEXT PREVENTATIVE PHYSICAL DUE IN 1 YEAR. Return in about 1 year (around 04/28/2023)  for CPE.

## 2022-04-28 ENCOUNTER — Other Ambulatory Visit: Payer: Self-pay | Admitting: Physician Assistant

## 2022-04-28 ENCOUNTER — Other Ambulatory Visit: Payer: Self-pay | Admitting: Rheumatology

## 2022-04-28 DIAGNOSIS — Z79899 Other long term (current) drug therapy: Secondary | ICD-10-CM

## 2022-04-28 DIAGNOSIS — M0579 Rheumatoid arthritis with rheumatoid factor of multiple sites without organ or systems involvement: Secondary | ICD-10-CM

## 2022-04-28 NOTE — Telephone Encounter (Signed)
Next Visit: 06/14/2022  Last Visit: 02/16/2022  Last Fill: 12/26/2021  DX: Rheumatoid arthritis involving multiple sites with positive rheumatoid factor   Current Dose per office note 02/16/2022: methotrexate to 6 tablets once weekly   Labs: 04/27/2022 Eosinophils Relative 5.3  Okay to refill MTX?

## 2022-05-11 ENCOUNTER — Other Ambulatory Visit: Payer: Self-pay | Admitting: Rheumatology

## 2022-05-11 DIAGNOSIS — Z79899 Other long term (current) drug therapy: Secondary | ICD-10-CM

## 2022-05-11 DIAGNOSIS — M0579 Rheumatoid arthritis with rheumatoid factor of multiple sites without organ or systems involvement: Secondary | ICD-10-CM

## 2022-05-11 NOTE — Telephone Encounter (Signed)
Next Visit: 06/14/2022  Last Visit: 02/16/2022  Last Fill: 02/13/2022  [id number] arthritis involving multiple sites with positive rheumatoid factor   Current Dose per office note 02/16/2022: Humira 40 mg every 14 days   Labs: Eosinophils Relative 5.3  TB Gold: 07/08/2021 Negative  Okay to refill Humira?

## 2022-05-15 ENCOUNTER — Telehealth: Payer: Self-pay | Admitting: Rheumatology

## 2022-05-15 NOTE — Telephone Encounter (Signed)
Patient called stating he received a letter from [employer] that they are not covering Humira prescription.  Patient requested a return call.

## 2022-05-15 NOTE — Telephone Encounter (Signed)
Patient called and advised patient of pharmacy info.

## 2022-05-15 NOTE — Telephone Encounter (Signed)
Attempted to contact the patient and left message for patient to call the office to discuss.  From what we have been seeing pharmacies have been sending over saying after June 19, 2022 some insurances will no longer be covering Humira but will be cover some of the Bio-similars. Thomas Davidson will be discussing this with as the time come and doing the Pa when the time comes. He should be able to go ahead and get his current refill of Humira.

## 2022-05-16 NOTE — Telephone Encounter (Signed)
Received notification from patient's insurance [employer] that HUMIRA will no longer be preferred adalimumab product on formulary. As of 06/19/2022, patient's insurance will prefer the following biosimilar(s): HYRIMOZ (Adalimumab-adaz) . Until this date patient will be able to fill Humira, but after this date, cost of Humira will increase for patient.  Discussed the switch to biosimilar with the patient. Reviewed that biosimilars are as clinically effective, as safe, and no more harmful than the originator product based on FDA studies. They are similar to generics but are different at molecular level and some require physician approval for the switch. Reviewed that biosimilars are FDA-approved and that studies are completed in patients who are clinically established on Humira and switched to biosimiliar. Discussed that dose and frequency are the same as for Humira.  Patient is in agreement to biosimilar switch from Humira to  [facility]). Pharmacy team will proceed with benefits investigation for biosimilar on or after 06/19/2022.  New start visit will not be needed for switch to  HYRIMOZ [facility]). However advised patient that if any issues with biosimilar injector device, patient should contact clinic and we can schedule training visit with pharmacy team.  Patient has 4 Humira pens at home right now and enough to get through April 2024.  Knox Saliva, PharmD, MPH, BCPS, CPP Clinical Pharmacist (Rheumatology and Pulmonology)

## 2022-05-31 NOTE — Progress Notes (Deleted)
Office Visit Note  Patient: Thomas Davidson             Date of Birth: [birth year]           MRN: [id number]             PCP: Charyl Dancer, NP Referring: Charyl Dancer, NP Visit Date: 06/14/2022 Occupation: '@GUAROCC'$ @  Subjective:  No chief complaint on file.   History of Present Illness: Thomas Davidson is a 62 y.o. male ***     Activities of Daily Living:  Patient reports morning stiffness for *** {minute/hour:19697}.   Patient {ACTIONS;DENIES/REPORTS:21021675::"Denies"} nocturnal pain.  Difficulty dressing/grooming: {ACTIONS;DENIES/REPORTS:21021675::"Denies"} Difficulty climbing stairs: {ACTIONS;DENIES/REPORTS:21021675::"Denies"} Difficulty getting out of chair: {ACTIONS;DENIES/REPORTS:21021675::"Denies"} Difficulty using hands for taps, buttons, cutlery, and/or writing: {ACTIONS;DENIES/REPORTS:21021675::"Denies"}  No Rheumatology ROS completed.   PMFS History:  Patient Active Problem List   Diagnosis Date Noted   Rheumatoid arthritis involving both hands with positive rheumatoid factor ([place]) 04/27/2022   Arthralgia of both hands 06/29/2021   Pain in both upper arms 04/25/2021   Elevated blood pressure reading 04/25/2021   History of BPH 06/12/2016   Neuroma 04/05/2015   Allergic rhinitis 11/19/2008    Past Medical History:  Diagnosis Date   Allergy    Asthma    as child   BPH (benign prostatic hyperplasia)    Rheumatoid arthritis ([place])     Family History  Problem Relation Age of Onset   Healthy Mother    Heart disease Father    Diabetes Maternal Grandfather    Healthy Son    Healthy Daughter    Healthy Daughter    Colon cancer Neg Hx    Past Surgical History:  Procedure Laterality Date   NO PAST SURGERIES     Social History   Social History Narrative    Married, 3 children, 6 grandchildren. Works as a Librarian, academic at [employer].      Immunization History  Administered Date(s) Administered   Influenza Split 02/05/2012   Influenza Whole  12/19/2006, 12/18/2008   Influenza,inj,Quad PF,6+ Mos 02/05/2013, 01/26/2014   Influenza-Unspecified 10/19/2014, 01/23/2021, 01/18/2022   Td 10/20/2002   Tdap 02/05/2013   Zoster Recombinat (Shingrix) 04/25/2021     Objective: Vital Signs: There were no vitals taken for this visit.   Physical Exam   Musculoskeletal Exam: ***  CDAI Exam: CDAI Score: -- Patient Global: --; Provider Global: -- Swollen: --; Tender: -- Joint Exam 06/14/2022   No joint exam has been documented for this visit   There is currently no information documented on the homunculus. Go to the Rheumatology activity and complete the homunculus joint exam.  Investigation: No additional findings.  Imaging: No results found.  Recent Labs: Lab Results  Component Value Date   WBC 6.7 04/27/2022   HGB 15.7 04/27/2022   PLT 210.0 04/27/2022   NA 140 04/27/2022   K 4.0 04/27/2022   CL 105 04/27/2022   CO2 26 04/27/2022   GLUCOSE 93 04/27/2022   BUN 12 04/27/2022   CREATININE 0.94 04/27/2022   BILITOT 0.7 04/27/2022   ALKPHOS 75 04/27/2022   AST 15 04/27/2022   ALT 24 04/27/2022   PROT 6.8 04/27/2022   ALBUMIN 4.0 04/27/2022   CALCIUM 9.2 04/27/2022   GFRAA 104 01/03/2007   QFTBGOLDPLUS NEGATIVE 07/08/2021    Speciality Comments: MTX 06/2021  Procedures:  No procedures performed Allergies: Bee venom   Assessment / Plan:     Visit Diagnoses: Rheumatoid arthritis involving multiple sites with positive  rheumatoid factor (HCC)  High risk medication use  Other fatigue  Primary osteoarthritis of both hands  Chronic pain of both knees  Primary osteoarthritis of both feet  Neuroma  Seasonal allergic rhinitis due to pollen  History of BPH  Orders: No orders of the defined types were placed in this encounter.  No orders of the defined types were placed in this encounter.   Face-to-face time spent with patient was *** minutes. Greater than 50% of time was spent in counseling and  coordination of care.  Follow-Up Instructions: No follow-ups on file.   Ofilia Neas, PA-C  Note - This record has been created using Dragon software.  Chart creation errors have been sought, but may not always  have been located. Such creation errors do not reflect on  the standard of medical care.

## 2022-06-12 NOTE — Progress Notes (Unsigned)
Office Visit Note  Patient: Thomas Davidson             Date of Birth: 04/04/1960           MRN: [id number]             PCP: Charyl Dancer, NP Referring: Charyl Dancer, NP Visit Date: 06/13/2022 Occupation: @GUAROCC @  Subjective:  Medication monitoring  History of Present Illness: Thomas Davidson is a 62 y.o. male with history of rheumatoid arthritis.  Patient is currently taking  Humira 40 mg every 14 days, methotrexate 6 tablets by mouth once weekly and folic acid 2 mg daily.  Patient reports that he recently had a gap in therapy for about 2 weeks while recovering from an upper respiratory viral infection.  He states he started to have increased pain and stiffness in both shoulders during the gap in therapy.  At that time he was having difficulty raising his arms above his head.  He states that his symptoms have improved significantly since resuming methotrexate and Humira as prescribed.  He continues to tolerate both medications without any side effects or injection site reactions from Humira. He denies any new medical conditions.  He continues to follow-up closely with dermatology.    Activities of Daily Living:  Patient reports morning stiffness for 0 minute.   Patient Denies nocturnal pain.  Difficulty dressing/grooming: Denies Difficulty climbing stairs: Denies Difficulty getting out of chair: Denies Difficulty using hands for taps, buttons, cutlery, and/or writing: Denies  Review of Systems  Constitutional:  Negative for fatigue.  HENT:  Negative for mouth sores and mouth dryness.   Eyes:  Negative for dryness.  Respiratory:  Negative for shortness of breath.   Cardiovascular:  Negative for chest pain and palpitations.  Gastrointestinal:  Negative for blood in stool, constipation and diarrhea.  Endocrine: Negative for increased urination.  Genitourinary:  Negative for involuntary urination.  Musculoskeletal:  Negative for joint pain, gait problem, joint pain, joint  swelling, myalgias, muscle weakness, morning stiffness, muscle tenderness and myalgias.  Skin:  Negative for color change, rash, hair loss and sensitivity to sunlight.  Allergic/Immunologic: Negative for susceptible to infections.  Neurological:  Negative for dizziness and headaches.  Hematological:  Negative for swollen glands.  Psychiatric/Behavioral:  Negative for depressed mood and sleep disturbance. The patient is not nervous/anxious.     PMFS History:  Patient Active Problem List   Diagnosis Date Noted   Rheumatoid arthritis involving both hands with positive rheumatoid factor ([place]) 04/27/2022   Arthralgia of both hands 06/29/2021   Pain in both upper arms 04/25/2021   Elevated blood pressure reading 04/25/2021   History of BPH 06/12/2016   Neuroma 04/05/2015   Allergic rhinitis 11/19/2008    Past Medical History:  Diagnosis Date   Allergy    Asthma    as child   BPH (benign prostatic hyperplasia)    Rheumatoid arthritis ([place])     Family History  Problem Relation Age of Onset   Healthy Mother    Heart disease Father    Diabetes Maternal Grandfather    Healthy Son    Healthy Daughter    Healthy Daughter    Colon cancer Neg Hx    Past Surgical History:  Procedure Laterality Date   NO PAST SURGERIES     Social History   Social History Narrative    Married, 3 children, 6 grandchildren. Works as a Librarian, academic at [employer].      Immunization History  Administered Date(s) Administered   Influenza Split 02/05/2012   Influenza Whole 12/19/2006, 12/18/2008   Influenza,inj,Quad PF,6+ Mos 02/05/2013, 01/26/2014   Influenza-Unspecified 10/19/2014, 01/23/2021, 01/18/2022   Td 10/20/2002   Tdap 02/05/2013   Zoster Recombinat (Shingrix) 04/25/2021     Objective: Vital Signs: BP (!) 143/83 (BP Location: Left Arm, Patient Position: Sitting, Cuff Size: Normal)   Pulse 94   Resp 14   Ht 6\' 1"  (1.854 m)   Wt 218 lb (98.9 kg)   BMI 28.76 kg/m    Physical  Exam Vitals and nursing note reviewed.  Constitutional:      Appearance: He is well-developed.  HENT:     Head: Normocephalic and atraumatic.  Eyes:     Conjunctiva/sclera: Conjunctivae normal.     Pupils: Pupils are equal, round, and reactive to light.  Cardiovascular:     Rate and Rhythm: Normal rate and regular rhythm.     Heart sounds: Normal heart sounds.  Pulmonary:     Effort: Pulmonary effort is normal.     Breath sounds: Normal breath sounds.  Abdominal:     General: Bowel sounds are normal.     Palpations: Abdomen is soft.  Musculoskeletal:     Cervical back: Normal range of motion and neck supple.  Skin:    General: Skin is warm and dry.     Capillary Refill: Capillary refill takes less than 2 seconds.  Neurological:     Mental Status: He is alert and oriented to person, place, and time.  Psychiatric:        Behavior: Behavior normal.      Musculoskeletal Exam: C-spine has slightly limited range of motion.  Shoulder joints have good range of motion with some discomfort in the right shoulder.  Elbow joints, wrist joints, MCPs, PIPs, DIPs have good range of motion with no synovitis.  Complete fist formation bilaterally.  PIP and DIP thickening consistent with osteoarthritis of both hands.  Hip joints have good range of motion with no groin pain.  Knee joints have good range of motion with no warmth or effusion.  Ankle joints have good range of motion with no tenderness or joint swelling.  CDAI Exam: CDAI Score: -- Patient Global: 1 mm; Provider Global: 1 mm Swollen: --; Tender: -- Joint Exam 06/13/2022   No joint exam has been documented for this visit   There is currently no information documented on the homunculus. Go to the Rheumatology activity and complete the homunculus joint exam.  Investigation: No additional findings.  Imaging: No results found.  Recent Labs: Lab Results  Component Value Date   WBC 6.7 04/27/2022   HGB 15.7 04/27/2022   PLT 210.0  04/27/2022   NA 140 04/27/2022   K 4.0 04/27/2022   CL 105 04/27/2022   CO2 26 04/27/2022   GLUCOSE 93 04/27/2022   BUN 12 04/27/2022   CREATININE 0.94 04/27/2022   BILITOT 0.7 04/27/2022   ALKPHOS 75 04/27/2022   AST 15 04/27/2022   ALT 24 04/27/2022   PROT 6.8 04/27/2022   ALBUMIN 4.0 04/27/2022   CALCIUM 9.2 04/27/2022   GFRAA 104 01/03/2007   QFTBGOLDPLUS NEGATIVE 07/08/2021    Speciality Comments: MTX 06/2021  Procedures:  No procedures performed Allergies: Bee venom   Assessment / Plan:     Visit Diagnoses: Rheumatoid arthritis involving multiple sites with positive rheumatoid factor (HCC) - +RF, +CCP Ab, ANA-, synovitis noted in multiple joints: He has no synovitis on examination today.  He is experiencing  some increased stiffness and soreness in his right shoulder which has been improving.  He recently had a 2-week gap in therapy while recovering from an upper respiratory viral infection.  During the gap in therapy he started to have increased pain and stiffness in both shoulder joints which has since started to improve.  He remains on Humira 40 mg subcutaneous injections every 14 days and methotrexate 6 tablets by mouth once weekly.  He is tolerating combination therapy without any side effects or injection site reactions from Humira.  No medication changes will be made at this time.  He was advised to notify us if he starts to have recurrent flares or recurrent infections.  He will follow-up in the office in 5 months or sooner if needed.  High risk medication use - Humira 40 mg sq injections every 14 days, methotrexate 6 tablets once weekly and folic acid 2 mg daily. CBC and CMP updated on 04/27/22. His next lab work will be due in May and every 3 months.  Standing orders for CBC and CMP were updated today. Discussed the importance of holding humira and methotrexate if he develops signs or symptoms of an infection and to resume once the infection has completely cleared. TB gold  negative on 07/08/21.  Future order for TB Gold placed today to be drawn with his upcoming lab work in May 2024. He continues to follow-up closely with dermatology.  Plan: QuantiFERON-TB Gold Plus, CBC with Differential/Platelet, COMPLETE METABOLIC PANEL WITH GFR  Screening for tuberculosis -Future order for TB Gold placed today.  Plan: QuantiFERON-TB Gold Plus  Other fatigue: Stable.   Primary osteoarthritis of both hands: PIP and DIP thickening consistent with osteoarthritis of both hands.  No tenderness or synovitis noted.  Complete fist formation bilaterally.  Chronic pain of both knees: Good range of motion of both knee joints on examination today.  No warmth or effusion noted.  Primary osteoarthritis of both feet: He is not experiencing any discomfort in his feet at this time.  He wears proper fitting shoes.  Other medical conditions are listed as follows:  Neuroma  Seasonal allergic rhinitis due to pollen  History of BPH    Orders: Orders Placed This Encounter  Procedures   QuantiFERON-TB Gold Plus   CBC with Differential/Platelet   COMPLETE METABOLIC PANEL WITH GFR   No orders of the defined types were placed in this encounter.    Follow-Up Instructions: Return in about 5 months (around 11/13/2022) for Rheumatoid arthritis.   Ofilia Neas, PA-C  Note - This record has been created using Dragon software.  Chart creation errors have been sought, but may not always  have been located. Such creation errors do not reflect on  the standard of medical care.

## 2022-06-13 ENCOUNTER — Ambulatory Visit: Payer: 59 | Attending: Rheumatology | Admitting: Physician Assistant

## 2022-06-13 ENCOUNTER — Encounter: Payer: Self-pay | Admitting: Physician Assistant

## 2022-06-13 VITALS — BP 143/83 | HR 94 | Resp 14 | Ht 73.0 in | Wt 218.0 lb

## 2022-06-13 DIAGNOSIS — M19041 Primary osteoarthritis, right hand: Secondary | ICD-10-CM

## 2022-06-13 DIAGNOSIS — M19072 Primary osteoarthritis, left ankle and foot: Secondary | ICD-10-CM

## 2022-06-13 DIAGNOSIS — Z79899 Other long term (current) drug therapy: Secondary | ICD-10-CM

## 2022-06-13 DIAGNOSIS — J301 Allergic rhinitis due to pollen: Secondary | ICD-10-CM

## 2022-06-13 DIAGNOSIS — D3610 Benign neoplasm of peripheral nerves and autonomic nervous system, unspecified: Secondary | ICD-10-CM

## 2022-06-13 DIAGNOSIS — M0579 Rheumatoid arthritis with rheumatoid factor of multiple sites without organ or systems involvement: Secondary | ICD-10-CM | POA: Diagnosis not present

## 2022-06-13 DIAGNOSIS — G8929 Other chronic pain: Secondary | ICD-10-CM

## 2022-06-13 DIAGNOSIS — R5383 Other fatigue: Secondary | ICD-10-CM | POA: Diagnosis not present

## 2022-06-13 DIAGNOSIS — Z111 Encounter for screening for respiratory tuberculosis: Secondary | ICD-10-CM

## 2022-06-13 DIAGNOSIS — M19042 Primary osteoarthritis, left hand: Secondary | ICD-10-CM

## 2022-06-13 DIAGNOSIS — Z87438 Personal history of other diseases of male genital organs: Secondary | ICD-10-CM

## 2022-06-13 DIAGNOSIS — M25561 Pain in right knee: Secondary | ICD-10-CM

## 2022-06-13 DIAGNOSIS — M25562 Pain in left knee: Secondary | ICD-10-CM

## 2022-06-13 DIAGNOSIS — M19071 Primary osteoarthritis, right ankle and foot: Secondary | ICD-10-CM

## 2022-06-13 NOTE — Patient Instructions (Signed)
Standing Labs We placed an order today for your standing lab work.   Please have your standing labs drawn in May and every 3 months   Please have your labs drawn 2 weeks prior to your appointment so that the provider can discuss your lab results at your appointment, if possible.  Please note that you may see your imaging and lab results in MyChart before we have reviewed them. We will contact you once all results are reviewed. Please allow our office up to 72 hours to thoroughly review all of the results before contacting the office for clarification of your results.  WALK-IN LAB HOURS  Monday through Thursday from 8:00 am -12:30 pm and 1:00 pm-5:00 pm and Friday from 8:00 am-12:00 pm.  Patients with office visits requiring labs will be seen before walk-in labs.  You may encounter longer than normal wait times. Please allow additional time. Wait times may be shorter on  Monday and Thursday afternoons.  We do not book appointments for walk-in labs. We appreciate your patience and understanding with our staff.   Labs are drawn by [facility]. Please bring your co-pay at the time of your lab draw.  You may receive a bill from [facility] for your lab work.  Please note if you are on Hydroxychloroquine and and an order has been placed for a Hydroxychloroquine level,  you will need to have it drawn 4 hours or more after your last dose.  If you wish to have your labs drawn at another location, please call the office 24 hours in advance so we can fax the orders.  The office is located at [address], [address], [place], [place] [address]   If you have any questions regarding directions or hours of operation,  please call [phone].   As a reminder, please drink plenty of water prior to coming for your lab work. Thanks!  

## 2022-06-14 ENCOUNTER — Ambulatory Visit: Payer: 59 | Admitting: Physician Assistant

## 2022-06-14 DIAGNOSIS — Z87438 Personal history of other diseases of male genital organs: Secondary | ICD-10-CM

## 2022-06-14 DIAGNOSIS — M19071 Primary osteoarthritis, right ankle and foot: Secondary | ICD-10-CM

## 2022-06-14 DIAGNOSIS — D3610 Benign neoplasm of peripheral nerves and autonomic nervous system, unspecified: Secondary | ICD-10-CM

## 2022-06-14 DIAGNOSIS — M0579 Rheumatoid arthritis with rheumatoid factor of multiple sites without organ or systems involvement: Secondary | ICD-10-CM

## 2022-06-14 DIAGNOSIS — J301 Allergic rhinitis due to pollen: Secondary | ICD-10-CM

## 2022-06-14 DIAGNOSIS — M19042 Primary osteoarthritis, left hand: Secondary | ICD-10-CM

## 2022-06-14 DIAGNOSIS — R5383 Other fatigue: Secondary | ICD-10-CM

## 2022-06-14 DIAGNOSIS — Z79899 Other long term (current) drug therapy: Secondary | ICD-10-CM

## 2022-06-14 DIAGNOSIS — G8929 Other chronic pain: Secondary | ICD-10-CM

## 2022-06-18 ENCOUNTER — Other Ambulatory Visit: Payer: Self-pay | Admitting: Rheumatology

## 2022-06-18 ENCOUNTER — Other Ambulatory Visit: Payer: Self-pay | Admitting: Nurse Practitioner

## 2022-06-18 DIAGNOSIS — Z87438 Personal history of other diseases of male genital organs: Secondary | ICD-10-CM

## 2022-06-19 ENCOUNTER — Other Ambulatory Visit: Payer: Self-pay | Admitting: Rheumatology

## 2022-06-19 ENCOUNTER — Encounter: Payer: Self-pay | Admitting: Rheumatology

## 2022-06-19 DIAGNOSIS — Z79899 Other long term (current) drug therapy: Secondary | ICD-10-CM

## 2022-06-19 DIAGNOSIS — M0579 Rheumatoid arthritis with rheumatoid factor of multiple sites without organ or systems involvement: Secondary | ICD-10-CM

## 2022-06-19 MED ORDER — ADALIMUMAB-ADAZ 40 MG/0.4ML ~~LOC~~ SOAJ: 40.0000 mg | SUBCUTANEOUS | 0 refills | Status: DC

## 2022-06-19 NOTE — Telephone Encounter (Signed)
Last Fill: 05/11/2022  Labs: 04/27/2022 Eosinophils 5.3  TB Gold: 07/08/2021   Next Visit: 11/14/2022  Last Visit: 06/13/2022  DX: Rheumatoid arthritis involving multiple sites with positive rheumatoid factor   Current Dose per office note 06/13/2022: Humira 40 mg sq injections every 14 days   Okay to refill Hyrimoz?

## 2022-06-19 NOTE — Telephone Encounter (Signed)
Last Fill: 07/15/2021  Next Visit: 11/14/2022  Last Visit: 06/13/2022  Dx: Rheumatoid arthritis involving multiple sites with positive rheumatoid factor   Current Dose per office note on [id number]: folic acid 2 mg daily   Okay to refill Folic Acid?

## 2022-06-21 ENCOUNTER — Telehealth: Payer: Self-pay | Admitting: Pharmacist

## 2022-06-21 NOTE — Telephone Encounter (Signed)
Per automated response on CMM for Hyrimoz PA: Your PA has been resolved, no additional PA is required. For further inquiries please contact the number on the back of the member prescription card. (Message [phone])  Humira PA automatically switched to Hyrimoz PA on 06/19/2022. HYRIMOZ authorization is active through 09/29/22  Rx was already [facility] to [facility]. MyChart message sent to patient to advise.  Knox Saliva, PharmD, MPH, BCPS, CPP Clinical Pharmacist (Rheumatology and Pulmonology)

## 2022-06-21 NOTE — Telephone Encounter (Signed)
Patient's insurance requiring him to switch from Humira to Hyrimoz. Submitted a Prior Authorization request to [facility] for  HYRIMOZ  via CoverMyMeds. Pending clinical questions to populate  Key: [id number]  Enrolled into copay card for Hyrimoz. This has been mailed to patient's home today as well   Knox Saliva, PharmD, MPH, BCPS, CPP Clinical Pharmacist (Rheumatology and Pulmonology)

## 2022-06-24 ENCOUNTER — Other Ambulatory Visit: Payer: Self-pay | Admitting: Family Medicine

## 2022-06-24 DIAGNOSIS — R0989 Other specified symptoms and signs involving the circulatory and respiratory systems: Secondary | ICD-10-CM

## 2022-07-02 ENCOUNTER — Other Ambulatory Visit: Payer: Self-pay | Admitting: Family Medicine

## 2022-07-02 DIAGNOSIS — R0989 Other specified symptoms and signs involving the circulatory and respiratory systems: Secondary | ICD-10-CM

## 2022-07-02 DIAGNOSIS — R0981 Nasal congestion: Secondary | ICD-10-CM

## 2022-07-05 NOTE — Telephone Encounter (Signed)
Requesting: Flonase Last Visit: 08/30/2021 Next Visit: 04/30/23 Last Refill: 08/30/21  Please Advise

## 2022-07-19 ENCOUNTER — Encounter: Payer: Self-pay | Admitting: *Deleted

## 2022-07-19 ENCOUNTER — Other Ambulatory Visit: Payer: Self-pay | Admitting: Physician Assistant

## 2022-07-19 DIAGNOSIS — Z79899 Other long term (current) drug therapy: Secondary | ICD-10-CM

## 2022-07-19 DIAGNOSIS — M0579 Rheumatoid arthritis with rheumatoid factor of multiple sites without organ or systems involvement: Secondary | ICD-10-CM

## 2022-07-19 NOTE — Telephone Encounter (Signed)
Last Fill: 04/28/2022  Labs: 04/27/2022 Eosinophils Relative 5.3  Next Visit: 11/14/2022  Last Visit: 06/13/2022  DX: Rheumatoid arthritis involving multiple sites with positive rheumatoid factor   Current Dose per office note 06/13/2022: methotrexate 6 tablets once weekly   Message sent to patient to advise he is due to update labs.   Okay to refill Methotrexate?

## 2022-07-21 ENCOUNTER — Other Ambulatory Visit: Payer: Self-pay | Admitting: *Deleted

## 2022-07-21 DIAGNOSIS — Z111 Encounter for screening for respiratory tuberculosis: Secondary | ICD-10-CM

## 2022-07-21 DIAGNOSIS — Z79899 Other long term (current) drug therapy: Secondary | ICD-10-CM

## 2022-07-21 LAB — CBC WITH DIFFERENTIAL/PLATELET
Hemoglobin: 16.1 g/dL (ref 13.2–17.1)
MCV: 93.3 fL (ref 80.0–100.0)
MPV: 10.3 fL (ref 7.5–12.5)
Neutrophils Relative %: 62.2 %
Total Lymphocyte: 25.7 %

## 2022-07-22 LAB — COMPLETE METABOLIC PANEL WITH GFR
AG Ratio: 1.4 (calc) (ref 1.0–2.5)
ALT: 38 U/L (ref 9–46)
AST: 24 U/L (ref 10–35)
Albumin: 4.2 g/dL (ref 3.6–5.1)
Alkaline phosphatase (APISO): 73 U/L (ref 35–144)
BUN: 14 mg/dL (ref 7–25)
CO2: 29 mmol/L (ref 20–32)
Calcium: 9.5 mg/dL (ref 8.6–10.3)
Chloride: 105 mmol/L (ref 98–110)
Creat: 1.04 mg/dL (ref 0.70–1.35)
Globulin: 2.9 g/dL (calc) (ref 1.9–3.7)
Glucose, Bld: 90 mg/dL (ref 65–99)
Potassium: 4.4 mmol/L (ref 3.5–5.3)
Sodium: 140 mmol/L (ref 135–146)
Total Bilirubin: 0.8 mg/dL (ref 0.2–1.2)
Total Protein: 7.1 g/dL (ref 6.1–8.1)
eGFR: 82 mL/min/{1.73_m2} (ref >=60)

## 2022-07-22 LAB — QUANTIFERON-TB GOLD PLUS
Mitogen-NIL: 8.34 IU/mL
NIL: 0.01 IU/mL
QuantiFERON-TB Gold Plus: NEGATIVE
TB1-NIL: 0 IU/mL
TB2-NIL: 0 IU/mL

## 2022-07-22 LAB — CBC WITH DIFFERENTIAL/PLATELET
Absolute Monocytes: 519 cells/uL (ref 200–950)
Basophils Absolute: 29 cells/uL (ref 0–200)
Basophils Relative: 0.5 %
Eosinophils Absolute: 143 cells/uL (ref 15–500)
Eosinophils Relative: 2.5 %
HCT: 46.2 % (ref 38.5–50.0)
Lymphs Abs: 1465 cells/uL (ref 850–3900)
MCH: 32.5 pg (ref 27.0–33.0)
MCHC: 34.8 g/dL (ref 32.0–36.0)
Monocytes Relative: 9.1 %
Neutro Abs: 3545 cells/uL (ref 1500–7800)
Platelets: 197 10*3/uL (ref 140–400)
RBC: 4.95 10*6/uL (ref 4.20–5.80)
RDW: 13 % (ref 11.0–15.0)
WBC: 5.7 10*3/uL (ref 3.8–10.8)

## 2022-07-24 NOTE — Progress Notes (Signed)
CBC and CMP WNL  TB Gold negative

## 2022-08-30 ENCOUNTER — Telehealth: Payer: Self-pay

## 2022-08-30 ENCOUNTER — Other Ambulatory Visit: Payer: Self-pay | Admitting: Physician Assistant

## 2022-08-30 NOTE — Telephone Encounter (Signed)
Last Fill: 06/19/2022  Labs: 07/21/2022 CBC and CMP WNL   TB Gold: 07/21/2022 Neg    Next Visit: 11/14/2022  Last Visit: 06/13/2022  GN:[id number] arthritis involving multiple sites with positive rheumatoid factor   Current Dose per office note 06/13/2022: Humira 40 mg sq injections every 14 days   Okay to refill Hyrimoz?

## 2022-08-30 NOTE — Telephone Encounter (Signed)
Submitted a Prior Authorization renewal request to [facility] for  adalimumab-adaz  via fax. Will update once we receive a response.  Phone: [phone] Fax: [phone] Case #: [id number]

## 2022-09-01 NOTE — Telephone Encounter (Signed)
Received notification from [facility] regarding a prior authorization for  adalimumab-adaz . Authorization has been APPROVED from 08/31/22 to 08/31/23. Approval letter sent to scan center.  Patient must continue to fill through [facility]: [phone]  Authorization # [id number]  Chesley Mires, PharmD, MPH, BCPS, CPP Clinical Pharmacist (Rheumatology and Pulmonology)

## 2022-09-14 NOTE — Addendum Note (Signed)
Addended by: Murrell Redden on: 09/14/2022 08:01 AM   Modules accepted: Orders

## 2022-10-12 ENCOUNTER — Other Ambulatory Visit: Payer: Self-pay | Admitting: Rheumatology

## 2022-10-12 DIAGNOSIS — Z79899 Other long term (current) drug therapy: Secondary | ICD-10-CM

## 2022-10-12 DIAGNOSIS — M0579 Rheumatoid arthritis with rheumatoid factor of multiple sites without organ or systems involvement: Secondary | ICD-10-CM

## 2022-10-12 NOTE — Telephone Encounter (Signed)
Last Fill: 07/19/2022  Labs: 07/21/2022 CBC and CMP WNL   Next Visit: 11/14/2022  Last Visit: 06/13/2022  DX: Rheumatoid arthritis involving multiple sites with positive rheumatoid factor   Current Dose per office note 06/13/2022: methotrexate 6 tablets once weekly   Okay to refill Methotrexate?

## 2022-10-31 NOTE — Progress Notes (Unsigned)
Office Visit Note  Patient: [name]             Date of Birth: 1960-08-24           MRN: [id number]             PCP: Gerre Scull, NP Referring: Gerre Scull, NP Visit Date: 11/14/2022 Occupation: @GUAROCC @  Subjective:  Medication monitoring   History of Present Illness: Thomas Davidson is a 62 y.o. male with history of seropositive rheumatoid arthritis and osteoarthritis.  Patient remains on Hyrimoz 40 mg sq injections every 14 days, methotrexate 6 tablets once weekly and folic acid 2 mg daily.  He is tolerating combination therapy without any side effects. He is tolerating these medications without any side effects or injection site reactions.  He denies any signs or symptoms of a flare.  He is not experiencing any joint swelling at this time.  He has some stiffness in his right hip after sitting for prolonged periods of time but denies any other joint pain or inflammation at this time.  He denies any recent or recurrent infections.   Activities of Daily Living:  Patient reports morning stiffness for a few minutes.   Patient Denies nocturnal pain.  Difficulty dressing/grooming: Denies Difficulty climbing stairs: Denies Difficulty getting out of chair: Denies Difficulty using hands for taps, buttons, cutlery, and/or writing: Denies  Review of Systems  Constitutional:  Positive for fatigue.  HENT:  Negative for mouth sores and mouth dryness.   Eyes:  Negative for dryness.  Respiratory:  Negative for shortness of breath.   Cardiovascular:  Negative for chest pain and palpitations.  Gastrointestinal:  Negative for blood in stool, constipation and diarrhea.  Endocrine: Negative for increased urination.  Genitourinary:  Negative for involuntary urination.  Musculoskeletal:  Positive for joint pain, joint pain and morning stiffness. Negative for gait problem, joint swelling, myalgias, muscle weakness, muscle tenderness and myalgias.  Skin:  Negative for color change, rash  and sensitivity to sunlight.  Allergic/Immunologic: Negative for susceptible to infections.  Neurological:  Negative for dizziness and headaches.  Hematological:  Negative for swollen glands.  Psychiatric/Behavioral:  Negative for depressed mood and sleep disturbance. The patient is not nervous/anxious.     PMFS History:  Patient Active Problem List   Diagnosis Date Noted   Rheumatoid arthritis involving both hands with positive rheumatoid factor (HCC) 04/27/2022   Arthralgia of both hands 06/29/2021   Pain in both upper arms 04/25/2021   Elevated blood pressure reading 04/25/2021   History of BPH 06/12/2016   Neuroma 04/05/2015   Allergic rhinitis 11/19/2008    Past Medical History:  Diagnosis Date   Allergy    Asthma    as child   BPH (benign prostatic hyperplasia)    Rheumatoid arthritis (HCC)     Family History  Problem Relation Age of Onset   Healthy Mother    Heart disease Father    Diabetes Maternal Grandfather    Healthy Son    Healthy Daughter    Healthy Daughter    Colon cancer Neg Hx    Past Surgical History:  Procedure Laterality Date   NO PAST SURGERIES     Social History   Social History Narrative    Married, 3 children, 6 grandchildren. Works as a Merchandiser, retail at [employer].      Immunization History  Administered Date(s) Administered   Influenza Split 02/05/2012   Influenza Whole 12/19/2006, 12/18/2008   Influenza,inj,Quad PF,6+ Mos 02/05/2013,  01/26/2014   Influenza-Unspecified 10/19/2014, 01/23/2021, 01/18/2022   Td 10/20/2002   Tdap 02/05/2013   Zoster Recombinant(Shingrix) 04/25/2021     Objective: Vital Signs: BP 133/88 (BP Location: Left Arm, Patient Position: Sitting, Cuff Size: Normal)   Pulse 93   Resp 17   Ht 6\' 1"  (1.854 m)   Wt 214 lb 9.6 oz (97.3 kg)   BMI 28.31 kg/m    Physical Exam Vitals and nursing note reviewed.  Constitutional:      Appearance: He is well-developed.  HENT:     Head: Normocephalic and atraumatic.   Eyes:     Conjunctiva/sclera: Conjunctivae normal.     Pupils: Pupils are equal, round, and reactive to light.  Cardiovascular:     Rate and Rhythm: Normal rate and regular rhythm.     Heart sounds: Normal heart sounds.  Pulmonary:     Effort: Pulmonary effort is normal.     Breath sounds: Normal breath sounds.  Abdominal:     General: Bowel sounds are normal.     Palpations: Abdomen is soft.  Musculoskeletal:     Cervical back: Normal range of motion and neck supple.  Skin:    General: Skin is warm and dry.     Capillary Refill: Capillary refill takes less than 2 seconds.  Neurological:     Mental Status: He is alert and oriented to person, place, and time.  Psychiatric:        Behavior: Behavior normal.      Musculoskeletal Exam: C-spine, thoracic spine, lumbar spine have good range of motion.  No midline spinal tenderness.  No SI joint tenderness.  Shoulder joints, elbow joints, wrist joints, MCPs, PIPs, DIPs have good range of motion with no synovitis.  Complete fist formation bilaterally.  PIP and DIP thickening consistent with osteoarthritis of both hands.  CMC joint prominence noted bilaterally.  Hip joints have slightly limited range of motion but no groin pain.  Knee joints have good range of motion with no warmth or effusion.  Ankle joints have good range of motion.  CDAI Exam: CDAI Score: -- Patient Global: 10 / 100; Provider Global: 10 / 100 Swollen: --; Tender: -- Joint Exam 11/14/2022   No joint exam has been documented for this visit   There is currently no information documented on the homunculus. Go to the Rheumatology activity and complete the homunculus joint exam.  Investigation: No additional findings.  Imaging: No results found.  Recent Labs: Lab Results  Component Value Date   WBC 5.7 07/21/2022   HGB 16.1 07/21/2022   PLT 197 07/21/2022   NA 140 07/21/2022   K 4.4 07/21/2022   CL 105 07/21/2022   CO2 29 07/21/2022   GLUCOSE 90 07/21/2022    BUN 14 07/21/2022   CREATININE 1.04 07/21/2022   BILITOT 0.8 07/21/2022   ALKPHOS 75 04/27/2022   AST 24 07/21/2022   ALT 38 07/21/2022   PROT 7.1 07/21/2022   ALBUMIN 4.0 04/27/2022   CALCIUM 9.5 07/21/2022   GFRAA 104 01/03/2007   QFTBGOLDPLUS NEGATIVE 07/21/2022    Speciality Comments: MTX 06/2021  Procedures:  No procedures performed Allergies: Bee venom    Assessment / Plan:     Visit Diagnoses: Rheumatoid arthritis involving multiple sites with positive rheumatoid factor (HCC) - +RF, +CCP Ab, ANA-, synovitis noted in multiple joints: He has no synovitis on examination today.  He has not had any signs or symptoms of a rheumatoid arthritis flare.  He has clinically been doing  well on Hyrimoz 40 mg sq injections every 14 days and methotrexate 6 tablets by mouth once weekly.  He is tolerating combination therapy without any side effects or injection site reactions.  He has not had any recent or recurrent infections.  He experiences some stiffness after sitting for prolonged periods of time but has not noticed any other new or worsening symptoms.  He will remain on Hyrimoz and methotrexate as prescribed.  He was advised to notify us if he develops signs or symptoms of a flare.  He will follow up in 5 months or sooner if needed.   High risk medication use - Hyrimoz 40 mg sq injections every 14 days, methotrexate 6 tablets once weekly and folic acid 2 mg daily. CBC and CMP updated on 07/21/22. Orders for CBC and CMP released today.  His next lab work will be due in November and every 3 months.  TB gold negative on 07/21/22.  No recent or recurrent infections.  Discussed the importance of holding humira and methotrexate if he develops signs or symptoms of an infection and to resume once the infection has completely cleared. Scheduled for yearly skin exam in January 2025.  - Plan: CBC with Differential/Platelet, COMPLETE METABOLIC PANEL WITH GFR  Other fatigue: His energy level has been  stable.   Primary osteoarthritis of both hands: PIP and DIP thickening consistent with osteoarthritis of both hands. Complete fist formation bilaterally.  No inflammation noted on examination.    Chronic pain of both knees: He has good range of motion of both knee joints on examination today.  No warmth or effusion noted.  Primary osteoarthritis of both feet: He is not experiencing any increased discomfort in his feet at this time.  He has good range of motion of both ankle joints.  Other medical conditions are listed as follows:   Neuroma  Seasonal allergic rhinitis due to pollen  History of BPH  Orders: Orders Placed This Encounter  Procedures   CBC with Differential/Platelet   COMPLETE METABOLIC PANEL WITH GFR   No orders of the defined types were placed in this encounter.     Follow-Up Instructions: Return in about 5 months (around 04/16/2023) for Rheumatoid arthritis.   Gearldine Bienenstock, PA-C  Note - This record has been created using Dragon software.  Chart creation errors have been sought, but may not always  have been located. Such creation errors do not reflect on  the standard of medical care.

## 2022-11-14 ENCOUNTER — Encounter: Payer: Self-pay | Admitting: Physician Assistant

## 2022-11-14 ENCOUNTER — Ambulatory Visit: Payer: 59 | Attending: Physician Assistant | Admitting: Physician Assistant

## 2022-11-14 VITALS — BP 133/88 | HR 93 | Resp 17 | Ht 73.0 in | Wt 214.6 lb

## 2022-11-14 DIAGNOSIS — Z87438 Personal history of other diseases of male genital organs: Secondary | ICD-10-CM

## 2022-11-14 DIAGNOSIS — M19041 Primary osteoarthritis, right hand: Secondary | ICD-10-CM | POA: Diagnosis not present

## 2022-11-14 DIAGNOSIS — M0579 Rheumatoid arthritis with rheumatoid factor of multiple sites without organ or systems involvement: Secondary | ICD-10-CM

## 2022-11-14 DIAGNOSIS — M19072 Primary osteoarthritis, left ankle and foot: Secondary | ICD-10-CM

## 2022-11-14 DIAGNOSIS — J301 Allergic rhinitis due to pollen: Secondary | ICD-10-CM

## 2022-11-14 DIAGNOSIS — R5383 Other fatigue: Secondary | ICD-10-CM | POA: Diagnosis not present

## 2022-11-14 DIAGNOSIS — Z79899 Other long term (current) drug therapy: Secondary | ICD-10-CM

## 2022-11-14 DIAGNOSIS — D3610 Benign neoplasm of peripheral nerves and autonomic nervous system, unspecified: Secondary | ICD-10-CM

## 2022-11-14 DIAGNOSIS — G8929 Other chronic pain: Secondary | ICD-10-CM

## 2022-11-14 DIAGNOSIS — M19042 Primary osteoarthritis, left hand: Secondary | ICD-10-CM

## 2022-11-14 DIAGNOSIS — M25562 Pain in left knee: Secondary | ICD-10-CM

## 2022-11-14 DIAGNOSIS — M25561 Pain in right knee: Secondary | ICD-10-CM

## 2022-11-14 DIAGNOSIS — M19071 Primary osteoarthritis, right ankle and foot: Secondary | ICD-10-CM

## 2022-11-14 NOTE — Patient Instructions (Signed)
Standing Labs We placed an order today for your standing lab work.   Please have your standing labs drawn end of November and every 3 months   Please have your labs drawn 2 weeks prior to your appointment so that the provider can discuss your lab results at your appointment, if possible.  Please note that you may see your imaging and lab results in MyChart before we have reviewed them. We will contact you once all results are reviewed. Please allow our office up to 72 hours to thoroughly review all of the results before contacting the office for clarification of your results.  WALK-IN LAB HOURS  Monday through Thursday from 8:00 am -12:30 pm and 1:00 pm-5:00 pm and Friday from 8:00 am-12:00 pm.  Patients with office visits requiring labs will be seen before walk-in labs.  You may encounter longer than normal wait times. Please allow additional time. Wait times may be shorter on  Monday and Thursday afternoons.  We do not book appointments for walk-in labs. We appreciate your patience and understanding with our staff.   Labs are drawn by [facility]. Please bring your co-pay at the time of your lab draw.  You may receive a bill from [facility] for your lab work.  Please note if you are on Hydroxychloroquine and and an order has been placed for a Hydroxychloroquine level,  you will need to have it drawn 4 hours or more after your last dose.  If you wish to have your labs drawn at another location, please call the office 24 hours in advance so we can fax the orders.  The office is located at [address], [place], [place] [address]   If you have any questions regarding directions or hours of operation,  please call [phone].   As a reminder, please drink plenty of water prior to coming for your lab work. Thanks!

## 2022-11-14 NOTE — Progress Notes (Signed)
CBC WNL

## 2022-11-15 LAB — CBC WITH DIFFERENTIAL/PLATELET
Absolute Monocytes: 806 cells/uL (ref 200–950)
Basophils Absolute: 38 {cells}/uL (ref 0–200)
Basophils Relative: 0.6 %
Eosinophils Absolute: 391 cells/uL (ref 15–500)
Eosinophils Relative: 6.2 %
HCT: 45.2 % (ref 38.5–50.0)
Hemoglobin: 16 g/dL (ref 13.2–17.1)
Lymphs Abs: 1613 {cells}/uL (ref 850–3900)
MCH: 33.3 pg — ABNORMAL HIGH (ref 27.0–33.0)
MCHC: 35.4 g/dL (ref 32.0–36.0)
MCV: 94 fL (ref 80.0–100.0)
MPV: 10.2 fL (ref 7.5–12.5)
Monocytes Relative: 12.8 %
Neutro Abs: 3452 {cells}/uL (ref 1500–7800)
Neutrophils Relative %: 54.8 %
Platelets: 192 10*3/uL (ref 140–400)
RBC: 4.81 10*6/uL (ref 4.20–5.80)
RDW: 13 % (ref 11.0–15.0)
Total Lymphocyte: 25.6 %
WBC: 6.3 10*3/uL (ref 3.8–10.8)

## 2022-11-15 LAB — COMPLETE METABOLIC PANEL WITH GFR
AG Ratio: 1.4 (calc) (ref 1.0–2.5)
ALT: 33 U/L (ref 9–46)
AST: 18 U/L (ref 10–35)
Albumin: 4.1 g/dL (ref 3.6–5.1)
Alkaline phosphatase (APISO): 79 U/L (ref 35–144)
BUN: 10 mg/dL (ref 7–25)
CO2: 29 mmol/L (ref 20–32)
Calcium: 9.3 mg/dL (ref 8.6–10.3)
Chloride: 104 mmol/L (ref 98–110)
Creat: 0.99 mg/dL (ref 0.70–1.35)
Globulin: 2.9 g/dL (ref 1.9–3.7)
Glucose, Bld: 87 mg/dL (ref 65–99)
Potassium: 4.1 mmol/L (ref 3.5–5.3)
Sodium: 141 mmol/L (ref 135–146)
Total Bilirubin: 0.7 mg/dL (ref 0.2–1.2)
Total Protein: 7 g/dL (ref 6.1–8.1)
eGFR: 87 mL/min/{1.73_m2} (ref >=60)

## 2022-11-15 NOTE — Progress Notes (Signed)
CMP WNL

## 2022-11-21 ENCOUNTER — Other Ambulatory Visit: Payer: Self-pay | Admitting: Physician Assistant

## 2022-11-21 NOTE — Telephone Encounter (Signed)
Last Fill: 08/30/2022  Labs: 11/14/2022 CBC WNL CMP WNL   TB Gold: 07/21/2022   Next Visit: 04/20/2023  Last Visit: 11/14/2022  DX: Rheumatoid arthritis involving multiple sites with positive rheumatoid factor   Current Dose per office note 11/14/2022: Hyrimoz 40 mg sq injections every 14 days   Okay to refill Hyrimoz?

## 2022-12-04 ENCOUNTER — Other Ambulatory Visit: Payer: Self-pay | Admitting: Physician Assistant

## 2022-12-04 DIAGNOSIS — M0579 Rheumatoid arthritis with rheumatoid factor of multiple sites without organ or systems involvement: Secondary | ICD-10-CM

## 2022-12-04 DIAGNOSIS — Z79899 Other long term (current) drug therapy: Secondary | ICD-10-CM

## 2022-12-06 ENCOUNTER — Telehealth: Payer: Self-pay | Admitting: Pharmacist

## 2022-12-06 DIAGNOSIS — Z79899 Other long term (current) drug therapy: Secondary | ICD-10-CM

## 2022-12-06 DIAGNOSIS — M0579 Rheumatoid arthritis with rheumatoid factor of multiple sites without organ or systems involvement: Secondary | ICD-10-CM

## 2022-12-06 NOTE — Telephone Encounter (Signed)
Submitted a Prior Authorization renewal request to [facility] for  HYRIMOZ  via CoverMyMeds. Pending clinical questions to populate  Key: [id number]  Chesley Mires, PharmD, MPH, BCPS, CPP Clinical Pharmacist (Rheumatology and Pulmonology)

## 2022-12-06 NOTE — Telephone Encounter (Signed)
Per response on CMM, "Your PA has been resolved, no additional PA is required. For further inquiries please contact the number on the back of the member prescription card"  Called [employer] to confirm Hyrimoz PA is active. Per rep, auth for [employer] is active. States a new PA for brand name HYRIMOZ is required for an 84 day supply. She will fax PA form to clinic  Chesley Mires, PharmD, MPH, BCPS, CPP Clinical Pharmacist (Rheumatology and Pulmonology)

## 2022-12-11 NOTE — Telephone Encounter (Signed)
Received notification from [facility] regarding a prior authorization for  HYRIMOZ . Authorization has been APPROVED from 12/11/22 to 12/11/23. Approval letter sent to scan center.  Patient must continue to fill through [facility]: [phone]  Authorization # [id number]  Chesley Mires, PharmD, MPH, BCPS, CPP Clinical Pharmacist (Rheumatology and Pulmonology)

## 2022-12-11 NOTE — Telephone Encounter (Signed)
Submitted a Prior Authorization request to [facility] for  HYRIMOZ  via fax. Will update once we receive a response.  Case # [id number] Fax: [phone] Phone: [phone]

## 2023-02-12 ENCOUNTER — Other Ambulatory Visit: Payer: Self-pay | Admitting: *Deleted

## 2023-02-12 DIAGNOSIS — Z79899 Other long term (current) drug therapy: Secondary | ICD-10-CM

## 2023-02-13 LAB — CBC WITH DIFFERENTIAL/PLATELET
Absolute Lymphocytes: 1716 {cells}/uL (ref 850–3900)
Absolute Monocytes: 733 {cells}/uL (ref 200–950)
Basophils Absolute: 53 {cells}/uL (ref 0–200)
Basophils Relative: 0.8 %
Eosinophils Absolute: 264 {cells}/uL (ref 15–500)
Eosinophils Relative: 4 %
HCT: 45.3 % (ref 38.5–50.0)
Hemoglobin: 16.2 g/dL (ref 13.2–17.1)
MCH: 34.3 pg — ABNORMAL HIGH (ref 27.0–33.0)
MCHC: 35.8 g/dL (ref 32.0–36.0)
MCV: 96 fL (ref 80.0–100.0)
MPV: 10.5 fL (ref 7.5–12.5)
Monocytes Relative: 11.1 %
Neutro Abs: 3835 {cells}/uL (ref 1500–7800)
Neutrophils Relative %: 58.1 %
Platelets: 197 10*3/uL (ref 140–400)
RBC: 4.72 10*6/uL (ref 4.20–5.80)
RDW: 12.3 % (ref 11.0–15.0)
Total Lymphocyte: 26 %
WBC: 6.6 10*3/uL (ref 3.8–10.8)

## 2023-02-13 LAB — COMPLETE METABOLIC PANEL WITH GFR
AG Ratio: 1.4 (calc) (ref 1.0–2.5)
ALT: 37 U/L (ref 9–46)
AST: 19 U/L (ref 10–35)
Albumin: 4.2 g/dL (ref 3.6–5.1)
Alkaline phosphatase (APISO): 78 U/L (ref 35–144)
BUN: 12 mg/dL (ref 7–25)
CO2: 27 mmol/L (ref 20–32)
Calcium: 9.4 mg/dL (ref 8.6–10.3)
Chloride: 104 mmol/L (ref 98–110)
Creat: 0.97 mg/dL (ref 0.70–1.35)
Globulin: 3 g/dL (ref 1.9–3.7)
Glucose, Bld: 95 mg/dL (ref 65–99)
Potassium: 4 mmol/L (ref 3.5–5.3)
Sodium: 142 mmol/L (ref 135–146)
Total Bilirubin: 0.6 mg/dL (ref 0.2–1.2)
Total Protein: 7.2 g/dL (ref 6.1–8.1)
eGFR: 89 mL/min/{1.73_m2} (ref >=60)

## 2023-02-13 NOTE — Progress Notes (Signed)
CBC and CMP WNL

## 2023-02-20 ENCOUNTER — Other Ambulatory Visit: Payer: Self-pay | Admitting: Physician Assistant

## 2023-02-20 NOTE — Telephone Encounter (Signed)
Last Fill: 11/21/2022  Labs: 02/12/2023 CBC and CMP WNL   TB Gold: 07/21/2022 Neg    Next Visit: 04/20/2023  Last Visit: 11/14/2022  [id number] arthritis involving multiple sites with positive rheumatoid factor   Current Dose per office note 11/14/2022: Hyrimoz 40 mg sq injections every 14 days   Okay to refill Hyrimoz?

## 2023-02-24 ENCOUNTER — Other Ambulatory Visit: Payer: Self-pay | Admitting: Physician Assistant

## 2023-02-24 DIAGNOSIS — M0579 Rheumatoid arthritis with rheumatoid factor of multiple sites without organ or systems involvement: Secondary | ICD-10-CM

## 2023-02-24 DIAGNOSIS — Z79899 Other long term (current) drug therapy: Secondary | ICD-10-CM

## 2023-02-26 NOTE — Telephone Encounter (Signed)
Last Fill: 10/12/2022  Labs: 02/12/2023 CBC and CMP WNL   Next Visit: 04/20/2023  Last Visit: 11/14/2022  DX: Rheumatoid arthritis involving multiple sites with positive rheumatoid factor   Current Dose per office note 11/14/2022: methotrexate 6 tablets once weekly   Okay to refill Methotrexate?

## 2023-04-06 NOTE — Progress Notes (Signed)
Office Visit Note  Patient: Thomas Davidson             Date of Birth: February 22, 1961           MRN: [id number]             PCP: Gerre Scull, NP Referring: Gerre Scull, NP Visit Date: 04/20/2023 Occupation: @GUAROCC @  Subjective:  Medication management  History of Present Illness: Thomas Davidson is a 63 y.o. male with seropositive rheumatoid arthritis and osteoarthritis.  He denies having a flare of rheumatoid arthritis in a long time.  He denies any joint stiffness.  He states after sitting for prolonged time he feels some tightness in the IT band and in the right hip.  None of the other joints are painful.  He has been taking Hyrimoz methotrexate and folic acid on a regular basis without any interruption.    Activities of Daily Living:  Patient reports morning stiffness for 5 minutes.   Patient Denies nocturnal pain.  Difficulty dressing/grooming: Denies Difficulty climbing stairs: Denies Difficulty getting out of chair: Denies Difficulty using hands for taps, buttons, cutlery, and/or writing: Denies  Review of Systems  Constitutional:  Negative for fatigue.  HENT:  Negative for mouth sores, mouth dryness and nose dryness.   Eyes:  Negative for pain and dryness.  Respiratory:  Negative for shortness of breath and difficulty breathing.   Cardiovascular:  Negative for chest pain and palpitations.  Gastrointestinal:  Negative for blood in stool, constipation and diarrhea.  Endocrine: Negative for increased urination.  Genitourinary:  Negative for involuntary urination.  Musculoskeletal:  Positive for joint pain, joint pain and morning stiffness. Negative for gait problem, joint swelling, myalgias, muscle weakness, muscle tenderness and myalgias.  Skin:  Negative for color change, rash and sensitivity to sunlight.  Allergic/Immunologic: Negative for susceptible to infections.  Neurological:  Negative for dizziness and headaches.  Hematological:  Negative for swollen glands.   Psychiatric/Behavioral:  Negative for depressed mood and sleep disturbance. The patient is not nervous/anxious.     PMFS History:  Patient Active Problem List   Diagnosis Date Noted   Rheumatoid arthritis involving both hands with positive rheumatoid factor (HCC) 04/27/2022   Arthralgia of both hands 06/29/2021   Pain in both upper arms 04/25/2021   Elevated blood pressure reading 04/25/2021   History of BPH 06/12/2016   Neuroma 04/05/2015   Allergic rhinitis 11/19/2008    Past Medical History:  Diagnosis Date   Allergy    Asthma    as child   BPH (benign prostatic hyperplasia)    Rheumatoid arthritis (HCC)     Family History  Problem Relation Age of Onset   Healthy Mother    Heart disease Father    Diabetes Maternal Grandfather    Healthy Son    Healthy Daughter    Healthy Daughter    Colon cancer Neg Hx    Past Surgical History:  Procedure Laterality Date   NO PAST SURGERIES     Social History   Social History Narrative    Married, 3 children, 6 grandchildren. Works as a Merchandiser, retail at [employer].      Immunization History  Administered Date(s) Administered   Influenza Split 02/05/2012   Influenza Whole 12/19/2006, 12/18/2008   Influenza,inj,Quad PF,6+ Mos 02/05/2013, 01/26/2014   Influenza-Unspecified 10/19/2014, 01/23/2021, 01/18/2022   Td 10/20/2002   Tdap 02/05/2013   Zoster Recombinant(Shingrix) 04/25/2021     Objective: Vital Signs: BP (!) 149/91 (BP Location: Left  Arm, Patient Position: Sitting, Cuff Size: Normal)   Pulse 81   Resp 15   Ht 6\' 1"  (1.854 m)   Wt 217 lb 12.8 oz (98.8 kg)   BMI 28.74 kg/m    Physical Exam Vitals and nursing note reviewed.  Constitutional:      Appearance: He is well-developed.  HENT:     Head: Normocephalic and atraumatic.  Eyes:     Conjunctiva/sclera: Conjunctivae normal.     Pupils: Pupils are equal, round, and reactive to light.  Cardiovascular:     Rate and Rhythm: Normal rate and regular rhythm.      Heart sounds: Normal heart sounds.  Pulmonary:     Effort: Pulmonary effort is normal.     Breath sounds: Normal breath sounds.  Abdominal:     General: Bowel sounds are normal.     Palpations: Abdomen is soft.  Musculoskeletal:     Cervical back: Normal range of motion and neck supple.  Skin:    General: Skin is warm and dry.     Capillary Refill: Capillary refill takes less than 2 seconds.  Neurological:     Mental Status: He is alert and oriented to person, place, and time.  Psychiatric:        Behavior: Behavior normal.      Musculoskeletal Exam: Cervical, thoracic and lumbar spine were in good range of motion.  Shoulder joints, elbow joints, wrist joints, MCPs PIPs and DIPs Juengel range of motion with no synovitis.  Hip joints, knee joints, ankles, MTPs were in good range of motion with no synovitis.  CDAI Exam: CDAI Score: -- Patient Global: --; Provider Global: -- Swollen: --; Tender: -- Joint Exam 04/20/2023   No joint exam has been documented for this visit   There is currently no information documented on the homunculus. Go to the Rheumatology activity and complete the homunculus joint exam.  Investigation: No additional findings.  Imaging: No results found.  Recent Labs: Lab Results  Component Value Date   WBC 6.6 02/12/2023   HGB 16.2 02/12/2023   PLT 197 02/12/2023   NA 142 02/12/2023   K 4.0 02/12/2023   CL 104 02/12/2023   CO2 27 02/12/2023   GLUCOSE 95 02/12/2023   BUN 12 02/12/2023   CREATININE 0.97 02/12/2023   BILITOT 0.6 02/12/2023   ALKPHOS 75 04/27/2022   AST 19 02/12/2023   ALT 37 02/12/2023   PROT 7.2 02/12/2023   ALBUMIN 4.0 04/27/2022   CALCIUM 9.4 02/12/2023   GFRAA 104 01/03/2007   QFTBGOLDPLUS NEGATIVE 07/21/2022    Speciality Comments: MTX 06/2021 Humira 07/23  Procedures:  No procedures performed Allergies: Bee venom   Assessment / Plan:     Visit Diagnoses: Rheumatoid arthritis involving multiple sites with  positive rheumatoid factor (HCC) - Positive RF, positive anti-CCP, ANA negative, synovitis involving multiple joints: Patient states he has been doing well on the combination of Hyrimoz and methotrexate.  He denies having a flare of rheumatoid arthritis.  I discussed spacing Hyrimoz to every 3 weeks at the next visit if he stays asymptomatic.  He has started Humira in July 2023.  High risk medication use -Hyrimoz 40 mg subcu every 14 days, methotrexate 6 tablets p.o. weekly, folic acid 2 mg p.o. daily, TB Gold Jul 21, 2022.  CBC and CMP were normal in November 2024.  Patient was advised to get repeat labs in February and every 3 months.  Information on immunization was placed in the AVS.  He  was advised to hold Hyrimoz and methotrexate if he develops an infection and resume after the infection resolves.  Annual skin examination was advised her due to increased risk of skin cancer.  Use of sunscreen and sun protection was advised.  Primary osteoarthritis of both hands-he did not have much discomfort in his hands.  Right IT band syndrome-he has some tightness in the right IT band.  A handout on IT band exercises was given.  Chronic pain of both knees-he denies any discomfort in his knees today.  He good range of motion without any warmth swelling or effusion.  Primary osteoarthritis of both feet-he denies discomfort.  Neuroma-currently not symptomatic.  Screening for hyperlipidemia - Plan: Lipid panel with next labs in February.  History of BPH  Seasonal allergic rhinitis due to pollen  Orders: Orders Placed This Encounter  Procedures   Lipid panel   No orders of the defined types were placed in this encounter.   Follow-Up Instructions: Return in about 5 months (around 09/17/2023) for Rheumatoid arthritis.   Pollyann Savoy, MD  Note - This record has been created using Animal nutritionist.  Chart creation errors have been sought, but may not always  have been located. Such creation  errors do not reflect on  the standard of medical care.

## 2023-04-20 ENCOUNTER — Encounter: Payer: Self-pay | Admitting: Rheumatology

## 2023-04-20 ENCOUNTER — Ambulatory Visit: Payer: 59 | Attending: Rheumatology | Admitting: Rheumatology

## 2023-04-20 VITALS — BP 149/91 | HR 81 | Resp 15 | Ht 73.0 in | Wt 217.8 lb

## 2023-04-20 DIAGNOSIS — M19041 Primary osteoarthritis, right hand: Secondary | ICD-10-CM | POA: Diagnosis not present

## 2023-04-20 DIAGNOSIS — M25561 Pain in right knee: Secondary | ICD-10-CM | POA: Diagnosis not present

## 2023-04-20 DIAGNOSIS — Z79899 Other long term (current) drug therapy: Secondary | ICD-10-CM

## 2023-04-20 DIAGNOSIS — M7631 Iliotibial band syndrome, right leg: Secondary | ICD-10-CM

## 2023-04-20 DIAGNOSIS — M0579 Rheumatoid arthritis with rheumatoid factor of multiple sites without organ or systems involvement: Secondary | ICD-10-CM | POA: Diagnosis not present

## 2023-04-20 DIAGNOSIS — M19042 Primary osteoarthritis, left hand: Secondary | ICD-10-CM

## 2023-04-20 DIAGNOSIS — Z87438 Personal history of other diseases of male genital organs: Secondary | ICD-10-CM

## 2023-04-20 DIAGNOSIS — D3610 Benign neoplasm of peripheral nerves and autonomic nervous system, unspecified: Secondary | ICD-10-CM

## 2023-04-20 DIAGNOSIS — M19071 Primary osteoarthritis, right ankle and foot: Secondary | ICD-10-CM

## 2023-04-20 DIAGNOSIS — G8929 Other chronic pain: Secondary | ICD-10-CM

## 2023-04-20 DIAGNOSIS — J301 Allergic rhinitis due to pollen: Secondary | ICD-10-CM

## 2023-04-20 DIAGNOSIS — M19072 Primary osteoarthritis, left ankle and foot: Secondary | ICD-10-CM

## 2023-04-20 DIAGNOSIS — Z13220 Encounter for screening for lipoid disorders: Secondary | ICD-10-CM

## 2023-04-20 DIAGNOSIS — M25562 Pain in left knee: Secondary | ICD-10-CM

## 2023-04-20 NOTE — Patient Instructions (Addendum)
Standing Labs We placed an order today for your standing lab work.   Please have your standing labs drawn in February and every 3 months  Please have your labs drawn 2 weeks prior to your appointment so that the provider can discuss your lab results at your appointment, if possible.  Please note that you may see your imaging and lab results in MyChart before we have reviewed them. We will contact you once all results are reviewed. Please allow our office up to 72 hours to thoroughly review all of the results before contacting the office for clarification of your results.  WALK-IN LAB HOURS  Monday through Thursday from 8:00 am -12:30 pm and 1:00 pm-5:00 pm and Friday from 8:00 am-12:00 pm.  Patients with office visits requiring labs will be seen before walk-in labs.  You may encounter longer than normal wait times. Please allow additional time. Wait times may be shorter on  Monday and Thursday afternoons.  We do not book appointments for walk-in labs. We appreciate your patience and understanding with our staff.   Labs are drawn by [facility]. Please bring your co-pay at the time of your lab draw.  You may receive a bill from [facility] for your lab work.  Please note if you are on Hydroxychloroquine and and an order has been placed for a Hydroxychloroquine level,  you will need to have it drawn 4 hours or more after your last dose.  If you wish to have your labs drawn at another location, please call the office 24 hours in advance so we can fax the orders.  The office is located at [address], [address], [place], [place] [address]   If you have any questions regarding directions or hours of operation,  please call [phone].   As a reminder, please drink plenty of water prior to coming for your lab work. Thanks!  Vaccines You are taking a medication(s) that can suppress your immune system.  The following immunizations are recommended: Flu annually Covid-19  RSV Td/Tdap (tetanus,  diphtheria, pertussis) every 10 years Pneumonia (Prevnar 15 then Pneumovax 23 at least 1 year apart.  Alternatively, can take Prevnar 20 without needing additional dose) Shingrix: 2 doses from 4 weeks to 6 months apart  Please check with your PCP to make sure you are up to date.   If you have signs or symptoms of an infection or start antibiotics: First, call your PCP for workup of your infection. Hold your medication through the infection, until you complete your antibiotics, and until symptoms resolve if you take the following: Injectable medication (Actemra, Benlysta, Cimzia, Cosentyx, Enbrel, Humira, Kevzara, Orencia, Remicade, Simponi, Stelara, Taltz, Tremfya) Methotrexate Leflunomide (Arava) Mycophenolate (Cellcept) Harriette Ohara, Olumiant, or Rinvoq   Please get an annual skin examination to screen for skin cancer.  Please use sunscreen and sun protection while you are on Humira.  Iliotibial Band Syndrome Rehab Ask your health care provider which exercises are safe for you. Do exercises exactly as told by your provider and adjust them as told. It's normal to feel mild stretching, pulling, tightness, or discomfort as you do these exercises. Stop right away if you feel sudden pain or your pain gets a lot worse. Do not begin these exercises until told by your provider. Stretching and range-of-motion exercises These exercises warm up your muscles and joints. They also improve the movement and flexibility of your hip and pelvis. Quadriceps stretch, prone  Lie face down (prone) on a firm surface like a bed or  padded floor. Bend your left / right knee. Reach back to hold your ankle or pant leg. If you can't reach your ankle or pant leg, use a belt looped around your foot and grab the belt instead. Gently pull your heel toward your butt. Your knee should not slide out to the side. You should feel a stretch in the front of your thigh and knee, also called the quadriceps. Hold this position for  __________ seconds. Repeat __________ times. Complete this exercise __________ times a day. Iliotibial band stretch The iliotibial band is a strip of tissue that runs along the outside of your hip down to your knee. Lie on your side with your left / right leg on top. Bend both knees and grab your left / right ankle. Stretch out your bottom arm to help you balance. Slowly bring your top knee back so your thigh goes behind your back. Slowly lower your top leg toward the floor until you feel a gentle stretch on the outside of your left / right hip and thigh. If you don't feel a stretch and your knee won't go farther, place the heel of your other foot on top of your knee and pull your knee down toward the floor with your foot. Hold this position for __________ seconds. Repeat __________ times. Complete this exercise __________ times a day. Strengthening exercises These exercises build strength and endurance in your hip and pelvis. Endurance means your muscles can keep working even when they're tired. Straight leg raises, side-lying This exercise strengthens the muscles that rotate the leg at the hip and move it away from your body. These muscles are called hip abductors. Lie on your side with your left / right leg on top. Lie so your head, shoulder, hip, and knee line up. You can bend your bottom knee to help you balance. Roll your hips slightly forward so they're stacked directly over each other. Your left / right knee should face forward. Tense the muscles in your outer thigh and hip. Lift your top leg 4-6 inches (10-15 cm) off the ground. Hold this position for __________ seconds. Slowly lower your leg back down to the starting position. Let your muscles fully relax before doing this exercise again. Repeat __________ times. Complete this exercise __________ times a day. Leg raises, prone This exercise strengthens the muscles that move the hips backward. These muscles are called hip  extensors. Lie face down (prone) on your bed or a firm surface. You can put a pillow under your hips for comfort and to support your lower back. Bend your left / right knee so your foot points straight up toward the ceiling. Keep the other leg straight and behind you. Squeeze your butt muscles. Lift your left / right thigh off the firm surface. Do not let your back arch. Tense your thigh muscle as hard as you can without having more knee pain. Hold this position for __________ seconds. Slowly lower your leg to the starting position. Allow your leg to relax all the way. Repeat __________ times. Complete this exercise __________ times a day. Hip hike  Stand sideways on a bottom step. Place your feet so that your left / right leg is on the step, and the other foot is hanging off the side. If you need support for balance, hold onto a railing or wall. Keep your knees straight and your abdomen square, meaning your hips are level. Then, lift your left / right hip up toward the ceiling. Slowly let your leg  that's hanging off the step lower towards the floor. Your foot should get closer to the ground. Do not lean or bend your knees during this movement. Repeat __________ times. Complete this exercise __________ times a day. This information is not intended to replace advice given to you by your health care provider. Make sure you discuss any questions you have with your health care provider. Document Revised: 05/19/2022 Document Reviewed: 05/19/2022 Elsevier Patient Education  2024 [employer].

## 2023-04-30 ENCOUNTER — Encounter: Payer: 59 | Admitting: Nurse Practitioner

## 2023-05-15 ENCOUNTER — Other Ambulatory Visit: Payer: Self-pay | Admitting: Physician Assistant

## 2023-05-15 DIAGNOSIS — Z9225 Personal history of immunosupression therapy: Secondary | ICD-10-CM

## 2023-05-15 DIAGNOSIS — Z111 Encounter for screening for respiratory tuberculosis: Secondary | ICD-10-CM

## 2023-05-15 NOTE — Telephone Encounter (Signed)
 Last Fill: 02/20/2023  Labs: 02/12/2023 CBC WNL CMP WNL   TB Gold: 07/21/2022 Neg    Next Visit: 09/18/2023  Last Visit: 04/20/2023  DX: Rheumatoid arthritis involving multiple sites with positive rheumatoid factor   Current Dose per office note 04/20/2023: Hyrimoz 40 mg subcu every 14 days   Patient advised he is due to update his lab work. Patient states he will come to the office tomorrow or Thursday to update.   Okay to refill Hyrimoz?

## 2023-05-16 ENCOUNTER — Other Ambulatory Visit: Payer: Self-pay | Admitting: *Deleted

## 2023-05-16 DIAGNOSIS — Z13220 Encounter for screening for lipoid disorders: Secondary | ICD-10-CM

## 2023-05-16 DIAGNOSIS — Z79899 Other long term (current) drug therapy: Secondary | ICD-10-CM

## 2023-05-17 LAB — COMPLETE METABOLIC PANEL WITH GFR
AG Ratio: 1.4 (calc) (ref 1.0–2.5)
ALT: 38 U/L (ref 9–46)
AST: 21 U/L (ref 10–35)
Albumin: 4.2 g/dL (ref 3.6–5.1)
Alkaline phosphatase (APISO): 68 U/L (ref 35–144)
BUN: 13 mg/dL (ref 7–25)
CO2: 29 mmol/L (ref 20–32)
Calcium: 9.5 mg/dL (ref 8.6–10.3)
Chloride: 104 mmol/L (ref 98–110)
Creat: 1.05 mg/dL (ref 0.70–1.35)
Globulin: 3 g/dL (ref 1.9–3.7)
Glucose, Bld: 90 mg/dL (ref 65–99)
Potassium: 4 mmol/L (ref 3.5–5.3)
Sodium: 140 mmol/L (ref 135–146)
Total Bilirubin: 0.7 mg/dL (ref 0.2–1.2)
Total Protein: 7.2 g/dL (ref 6.1–8.1)
eGFR: 81 mL/min/{1.73_m2} (ref >=60)

## 2023-05-17 LAB — CBC WITH DIFFERENTIAL/PLATELET
Absolute Lymphocytes: 1619 {cells}/uL (ref 850–3900)
Absolute Monocytes: 680 {cells}/uL (ref 200–950)
Basophils Absolute: 50 {cells}/uL (ref 0–200)
Basophils Relative: 0.8 %
Eosinophils Absolute: 271 {cells}/uL (ref 15–500)
Eosinophils Relative: 4.3 %
HCT: 48.5 % (ref 38.5–50.0)
Hemoglobin: 16.8 g/dL (ref 13.2–17.1)
MCH: 33.3 pg — ABNORMAL HIGH (ref 27.0–33.0)
MCHC: 34.6 g/dL (ref 32.0–36.0)
MCV: 96 fL (ref 80.0–100.0)
MPV: 10 fL (ref 7.5–12.5)
Monocytes Relative: 10.8 %
Neutro Abs: 3679 {cells}/uL (ref 1500–7800)
Neutrophils Relative %: 58.4 %
Platelets: 199 10*3/uL (ref 140–400)
RBC: 5.05 10*6/uL (ref 4.20–5.80)
RDW: 13 % (ref 11.0–15.0)
Total Lymphocyte: 25.7 %
WBC: 6.3 10*3/uL (ref 3.8–10.8)

## 2023-05-17 LAB — LIPID PANEL
Cholesterol: 144 mg/dL (ref <200)
HDL: 50 mg/dL (ref >=40)
LDL Cholesterol (Calc): 74 mg/dL
Non-HDL Cholesterol (Calc): 94 mg/dL (ref <130)
Total CHOL/HDL Ratio: 2.9 (calc) (ref <5.0)
Triglycerides: 117 mg/dL (ref <150)

## 2023-05-17 NOTE — Progress Notes (Signed)
 CBC and CMP WNL

## 2023-05-17 NOTE — Progress Notes (Signed)
 Lipid panel was normal

## 2023-05-20 ENCOUNTER — Other Ambulatory Visit: Payer: Self-pay | Admitting: Rheumatology

## 2023-05-20 DIAGNOSIS — M0579 Rheumatoid arthritis with rheumatoid factor of multiple sites without organ or systems involvement: Secondary | ICD-10-CM

## 2023-05-20 DIAGNOSIS — Z79899 Other long term (current) drug therapy: Secondary | ICD-10-CM

## 2023-05-21 NOTE — Telephone Encounter (Signed)
 Last Fill: 02/26/2023  Labs: 05/15/2022 CBC and CMP WNL   Next Visit: 09/18/2023  Last Visit: 04/20/2023  DX: Rheumatoid arthritis involving multiple sites with positive rheumatoid factor   Current Dose per office note 04/20/2023: methotrexate 6 tablets p.o. weekly   Okay to refill Methotrexate?

## 2023-05-21 NOTE — Telephone Encounter (Signed)
 Reviewed labs from 05/16/23

## 2023-05-24 ENCOUNTER — Ambulatory Visit (INDEPENDENT_AMBULATORY_CARE_PROVIDER_SITE_OTHER): Payer: 59 | Admitting: Nurse Practitioner

## 2023-05-24 ENCOUNTER — Encounter: Payer: Self-pay | Admitting: Nurse Practitioner

## 2023-05-24 VITALS — BP 130/80 | HR 83 | Temp 98.3°F | Ht 73.0 in | Wt 220.6 lb

## 2023-05-24 DIAGNOSIS — M05742 Rheumatoid arthritis with rheumatoid factor of left hand without organ or systems involvement: Secondary | ICD-10-CM

## 2023-05-24 DIAGNOSIS — Z23 Encounter for immunization: Secondary | ICD-10-CM | POA: Diagnosis not present

## 2023-05-24 DIAGNOSIS — Z0000 Encounter for general adult medical examination without abnormal findings: Secondary | ICD-10-CM | POA: Diagnosis not present

## 2023-05-24 DIAGNOSIS — M05741 Rheumatoid arthritis with rheumatoid factor of right hand without organ or systems involvement: Secondary | ICD-10-CM

## 2023-05-24 NOTE — Progress Notes (Signed)
 BP 130/80 (BP Location: Left Arm, Patient Position: Sitting, Cuff Size: Normal)   Pulse 83   Temp 98.3 F (36.8 C) (Oral)   Ht 6\' 1"  (1.854 m)   Wt 220 lb 9.6 oz (100.1 kg)   SpO2 97%   BMI 29.10 kg/m    Subjective:    Patient ID: Thomas Davidson, male    DOB: 1960-10-31, 63 y.o.   MRN: [id number]  CC: Chief Complaint  Patient presents with  . Annual Exam    With fasting lab work, no concerns    HPI: Thomas Davidson is a 63 y.o. male presenting on 05/24/2023 for comprehensive medical examination. Current medical complaints include:none  He currently lives with: wife  Depression and Anxiety Screen done today and results listed below:     05/24/2023    8:55 AM 04/27/2022    9:03 AM 04/27/2022    8:45 AM 04/25/2021    9:05 AM 06/28/2017    9:26 AM  Depression screen PHQ 2/9  Decreased Interest 0 0 0 0 0  Down, Depressed, Hopeless 0 0 0 0 0  PHQ - 2 Score 0 0 0 0 0  Altered sleeping 0 0     Tired, decreased energy 0 0     Change in appetite 0 0     Feeling bad or failure about yourself  0 0     Trouble concentrating 0 0     Moving slowly or fidgety/restless 0 0     Suicidal thoughts 0 0     PHQ-9 Score 0 0     Difficult doing work/chores Not difficult at all Not difficult at all         05/24/2023    8:56 AM 04/27/2022    9:03 AM  GAD 7 : Generalized Anxiety Score  Nervous, Anxious, on Edge 0 0  Control/stop worrying 0 0  Worry too much - different things 0 0  Trouble relaxing 0 0  Restless 0 0  Easily annoyed or irritable 1 0  Afraid - awful might happen 0 0  Total GAD 7 Score 1 0  Anxiety Difficulty Somewhat difficult Not difficult at all    The patient does not have a history of falls. I did not complete a risk assessment for falls. A plan of care for falls was not documented.   Past Medical History:  Past Medical History:  Diagnosis Date  . Allergy   . Asthma    as child  . BPH (benign prostatic hyperplasia)   . Rheumatoid arthritis [facility])     Surgical History:   Past Surgical History:  Procedure Laterality Date  . NO PAST SURGERIES      Medications:  Current Outpatient Medications on File Prior to Visit  Medication Sig  . aspirin 81 MG EC tablet Take 81 mg by mouth daily. Swallow whole.  . benzonatate (TESSALON) 100 MG capsule Take 1 capsule (100 mg total) by mouth 3 (three) times daily as needed for cough.  . cetirizine (ZYRTEC) 10 MG tablet Take 10 mg by mouth daily.  . fluticasone (FLONASE) 50 MCG/ACT nasal spray SPRAY 2 SPRAYS INTO EACH NOSTRIL EVERY DAY  . folic acid (FOLVITE) 1 MG tablet TAKE 2 TABLETS BY MOUTH EVERY DAY  . HYRIMOZ 40 MG/0.4ML SOAJ INJECT 1 PEN UNDER THE SKIN EVERY 14 DAYS  . methotrexate (RHEUMATREX) 2.5 MG tablet TAKE 6 TABLETS (15 MG TOTAL) BY MOUTH ONCE A WEEK. CAUTION:CHEMOTHERAPY. PROTECT FROM LIGHT.  Marland Kitchen montelukast (  SINGULAIR) 10 MG tablet TAKE 1 TABLET BY MOUTH EVERYDAY AT BEDTIME  . tamsulosin (FLOMAX) 0.4 MG CAPS capsule TAKE 1 CAPSULE BY MOUTH EVERY DAY   No current facility-administered medications on file prior to visit.    Allergies:  Allergies  Allergen Reactions  . Bee Venom Anaphylaxis and Hives    Social History:  Social History   Socioeconomic History  . Marital status: Married    Spouse name: Not on file  . Number of children: Not on file  . Years of education: Not on file  . Highest education level: Not on file  Occupational History  . Not on file  Tobacco Use  . Smoking status: Never    Passive exposure: Never  . Smokeless tobacco: Current    Types: Chew  Vaping Use  . Vaping status: Never Used  Substance and Sexual Activity  . Alcohol use: No  . Drug use: No  . Sexual activity: Not on file  Other Topics Concern  . Not on file  Social History Narrative    Married, 3 children, 6 grandchildren. Works as a Merchandiser, retail at [employer].      Social Drivers of Corporate investment banker Strain: Not on file  Food Insecurity: Not on file  Transportation Needs: Not on file   Physical Activity: Not on file  Stress: Not on file  Social Connections: Not on file  Intimate Partner Violence: Not on file   Social History   Tobacco Use  Smoking Status Never  . Passive exposure: Never  Smokeless Tobacco Current  . Types: Chew   Social History   Substance and Sexual Activity  Alcohol Use No    Family History:  Family History  Problem Relation Age of Onset  . Healthy Mother   . Heart disease Father   . Diabetes Maternal Grandfather   . Healthy Son   . Healthy Daughter   . Healthy Daughter   . Colon cancer Neg Hx     Past medical history, surgical history, medications, allergies, family history and social history reviewed with patient today and changes made to appropriate areas of the chart.   Review of Systems  Constitutional:  Positive for malaise/fatigue (at times). Negative for fever.  HENT: Negative.    Eyes: Negative.   Respiratory: Negative.    Cardiovascular: Negative.   Gastrointestinal: Negative.   Genitourinary: Negative.   Musculoskeletal: Negative.   Skin: Negative.   Neurological: Negative.   Psychiatric/Behavioral: Negative.     All other ROS negative except what is listed above and in the HPI.      Objective:    BP 130/80 (BP Location: Left Arm, Patient Position: Sitting, Cuff Size: Normal)   Pulse 83   Temp 98.3 F (36.8 C) (Oral)   Ht 6\' 1"  (1.854 m)   Wt 220 lb 9.6 oz (100.1 kg)   SpO2 97%   BMI 29.10 kg/m   Wt Readings from Last 3 Encounters:  05/24/23 220 lb 9.6 oz (100.1 kg)  04/20/23 217 lb 12.8 oz (98.8 kg)  11/14/22 214 lb 9.6 oz (97.3 kg)    Physical Exam Vitals and nursing note reviewed.  Constitutional:      General: He is not in acute distress.    Appearance: Normal appearance.  HENT:     Head: Normocephalic and atraumatic.     Right Ear: Tympanic membrane, ear canal and external ear normal.     Left Ear: Tympanic membrane, ear canal and external ear normal.  Eyes:     Conjunctiva/sclera:  Conjunctivae normal.  Cardiovascular:     Rate and Rhythm: Normal rate and regular rhythm.     Pulses: Normal pulses.     Heart sounds: Normal heart sounds.  Pulmonary:     Effort: Pulmonary effort is normal.     Breath sounds: Normal breath sounds.  Abdominal:     Palpations: Abdomen is soft.     Tenderness: There is no abdominal tenderness.  Musculoskeletal:        General: Normal range of motion.     Cervical back: Normal range of motion and neck supple. No tenderness.     Right lower leg: No edema.     Left lower leg: No edema.  Lymphadenopathy:     Cervical: No cervical adenopathy.  Skin:    General: Skin is warm and dry.  Neurological:     General: No focal deficit present.     Mental Status: He is alert and oriented to person, place, and time.     Cranial Nerves: No cranial nerve deficit.     Coordination: Coordination normal.     Gait: Gait normal.  Psychiatric:        Mood and Affect: Mood normal.        Behavior: Behavior normal.        Thought Content: Thought content normal.        Judgment: Judgment normal.    Results for orders placed or performed in visit on 05/16/23  Lipid panel   Collection Time: 05/16/23  8:18 AM  Result Value Ref Range   Cholesterol 144 <200 mg/dL   HDL 50 > OR = 40 mg/dL   Triglycerides 161 <096 mg/dL   LDL Cholesterol (Calc) 74 mg/dL (calc)   Total CHOL/HDL Ratio 2.9 <5.0 (calc)   Non-HDL Cholesterol (Calc) 94 <045 mg/dL (calc)  COMPLETE METABOLIC PANEL WITH GFR   Collection Time: 05/16/23  8:18 AM  Result Value Ref Range   Glucose, Bld 90 65 - 99 mg/dL   BUN 13 7 - 25 mg/dL   Creat 4.09 8.11 - 9.14 mg/dL   eGFR 81 > OR = 60 NW/GNF/6.21H0   BUN/Creatinine Ratio SEE NOTE: 6 - 22 (calc)   Sodium 140 135 - 146 mmol/L   Potassium 4.0 3.5 - 5.3 mmol/L   Chloride 104 98 - 110 mmol/L   CO2 29 20 - 32 mmol/L   Calcium 9.5 8.6 - 10.3 mg/dL   Total Protein 7.2 6.1 - 8.1 g/dL   Albumin 4.2 3.6 - 5.1 g/dL   Globulin 3.0 1.9 - 3.7  g/dL (calc)   AG Ratio 1.4 1.0 - 2.5 (calc)   Total Bilirubin 0.7 0.2 - 1.2 mg/dL   Alkaline phosphatase (APISO) 68 35 - 144 U/L   AST 21 10 - 35 U/L   ALT 38 9 - 46 U/L  CBC with Differential/Platelet   Collection Time: 05/16/23  8:18 AM  Result Value Ref Range   WBC 6.3 3.8 - 10.8 Thousand/uL   RBC 5.05 4.20 - 5.80 Million/uL   Hemoglobin 16.8 13.2 - 17.1 g/dL   HCT 86.5 78.4 - 69.6 %   MCV 96.0 80.0 - 100.0 fL   MCH 33.3 (H) 27.0 - 33.0 pg   MCHC 34.6 32.0 - 36.0 g/dL   RDW 29.5 28.4 - 13.2 %   Platelets 199 140 - 400 Thousand/uL   MPV 10.0 7.5 - 12.5 fL   Neutro Abs 3,679 1,500 - 7,800 cells/uL  Absolute Lymphocytes 1,619 850 - 3,900 cells/uL   Absolute Monocytes 680 200 - 950 cells/uL   Eosinophils Absolute 271 15 - 500 cells/uL   Basophils Absolute 50 0 - 200 cells/uL   Neutrophils Relative % 58.4 %   Total Lymphocyte 25.7 %   Monocytes Relative 10.8 %   Eosinophils Relative 4.3 %   Basophils Relative 0.8 %      Assessment & Plan:   Problem List Items Addressed This Visit       Musculoskeletal and Integument   Rheumatoid arthritis involving both hands with positive rheumatoid factor (HCC)   Chronic, stable. Continue methotrexate 15mg  weekly and hyrimoz injection 40mg  every 2 weeks. Continue collaboration and recommendations from rheumatology.         Other   Routine general medical examination at a health care facility - Primary   Health maintenance reviewed and updated. Discussed nutrition, exercise. Recent CMP, lipid, and CBC reviewed. Follow-up 1 year.        Other Visit Diagnoses       Immunization due       Prevnar 20 and Tdap given today   Relevant Orders   Tdap vaccine greater than or equal to 7yo IM (Completed)   Pneumococcal conjugate vaccine 20-valent (Completed)        LABORATORY TESTING:  Health maintenance labs ordered today as discussed above.   The natural history of prostate cancer and ongoing controversy regarding screening and  potential treatment outcomes of prostate cancer has been discussed with the patient. The meaning of a false positive PSA and a false negative PSA has been discussed. He indicates understanding of the limitations of this screening test and wishes not to proceed with screening PSA testing this year. Will screen again next year.    IMMUNIZATIONS:   - Tdap: Tetanus vaccination status reviewed: Td vaccination indicated and given today. - Influenza: Up to date - Pneumovax: Not applicable - Prevnar: Administered today - HPV: Not applicable - Shingrix vaccine: Declined  SCREENING: - Colonoscopy: Up to date  Discussed with patient purpose of the colonoscopy is to detect colon cancer at curable precancerous or early stages   - AAA Screening: Not applicable   PATIENT COUNSELING:    Sexuality: Discussed sexually transmitted diseases, partner selection, use of condoms, avoidance of unintended pregnancy  and contraceptive alternatives.   Advised to avoid cigarette smoking.  I discussed with the patient that most people either abstain from alcohol or drink within safe limits (<=14/week and <=4 drinks/occasion for males, <=7/weeks and <= 3 drinks/occasion for females) and that the risk for alcohol disorders and other health effects rises proportionally with the number of drinks per week and how often a drinker exceeds daily limits.  Discussed cessation/primary prevention of drug use and availability of treatment for abuse.   Diet: Encouraged to adjust caloric intake to maintain  or achieve ideal body weight, to reduce intake of dietary saturated fat and total fat, to limit sodium intake by avoiding high sodium foods and not adding table salt, and to maintain adequate dietary potassium and calcium preferably from fresh fruits, vegetables, and low-fat dairy products.    stressed the importance of regular exercise  Injury prevention: Discussed safety belts, safety helmets, smoke detector, smoking near  bedding or upholstery.   Dental health: Discussed importance of regular tooth brushing, flossing, and dental visits.   Follow up plan: NEXT PREVENTATIVE PHYSICAL DUE IN 1 YEAR. Return in about 1 year (around 05/23/2024) for CPE.  Aubery Douthat A Saifan Rayford

## 2023-05-24 NOTE — Assessment & Plan Note (Signed)
 Health maintenance reviewed and updated. Discussed nutrition, exercise. Recent CMP, lipid, and CBC reviewed. Follow-up 1 year.

## 2023-05-24 NOTE — Assessment & Plan Note (Signed)
 Chronic, stable. Continue methotrexate 15mg  weekly and hyrimoz injection 40mg  every 2 weeks. Continue collaboration and recommendations from rheumatology.

## 2023-05-24 NOTE — Patient Instructions (Signed)
It was great to see you!  Keep up the great work!   Let's follow-up in 1 year, sooner if you have concerns.  If a referral was placed today, you will be contacted for an appointment. Please note that routine referrals can sometimes take up to 3-4 weeks to process. Please call our office if you haven't heard anything after this time frame.  Take care,  Adelise Buswell, NP  

## 2023-07-28 ENCOUNTER — Other Ambulatory Visit: Payer: Self-pay | Admitting: Nurse Practitioner

## 2023-07-28 ENCOUNTER — Other Ambulatory Visit: Payer: Self-pay | Admitting: Family

## 2023-07-28 DIAGNOSIS — R0989 Other specified symptoms and signs involving the circulatory and respiratory systems: Secondary | ICD-10-CM

## 2023-07-28 DIAGNOSIS — Z87438 Personal history of other diseases of male genital organs: Secondary | ICD-10-CM

## 2023-07-30 NOTE — Telephone Encounter (Signed)
 Requesting: TAMSULOSIN  HCL 0.4 MG CAPSULE  Last Visit: 05/24/2023 Next Visit: 05/26/2024 Last Refill: 06/19/2022  Please Advise

## 2023-08-07 ENCOUNTER — Other Ambulatory Visit: Payer: Self-pay | Admitting: Physician Assistant

## 2023-08-07 NOTE — Telephone Encounter (Signed)
 Last Fill: 05/15/2023  Labs: 05/16/2023 CBC and CMP WNL   TB Gold: 07/21/2022  TB Gold negative   Next Visit: 09/18/2023  Last Visit: 04/20/2023  [id number] arthritis involving multiple sites with positive rheumatoid factor   Current Dose per office note 04/20/2023: Hyrimoz  40 mg subcu every 14 days   Patient advised that he is needed to update labs and his TB Gold, stated he would be by this week.  Okay to refill Hyrimoz ?

## 2023-08-09 ENCOUNTER — Other Ambulatory Visit: Payer: Self-pay | Admitting: *Deleted

## 2023-08-09 DIAGNOSIS — Z111 Encounter for screening for respiratory tuberculosis: Secondary | ICD-10-CM

## 2023-08-09 DIAGNOSIS — Z9225 Personal history of immunosupression therapy: Secondary | ICD-10-CM

## 2023-08-09 DIAGNOSIS — Z79899 Other long term (current) drug therapy: Secondary | ICD-10-CM

## 2023-08-10 ENCOUNTER — Ambulatory Visit: Payer: Self-pay | Admitting: Physician Assistant

## 2023-08-10 NOTE — Progress Notes (Signed)
 CBC and CMP WNL

## 2023-08-12 ENCOUNTER — Other Ambulatory Visit: Payer: Self-pay | Admitting: Physician Assistant

## 2023-08-12 DIAGNOSIS — Z79899 Other long term (current) drug therapy: Secondary | ICD-10-CM

## 2023-08-12 DIAGNOSIS — M0579 Rheumatoid arthritis with rheumatoid factor of multiple sites without organ or systems involvement: Secondary | ICD-10-CM

## 2023-08-12 LAB — CBC WITH DIFFERENTIAL/PLATELET
Absolute Lymphocytes: 1581 {cells}/uL (ref 850–3900)
Absolute Monocytes: 685 {cells}/uL (ref 200–950)
Basophils Absolute: 51 {cells}/uL (ref 0–200)
Basophils Relative: 0.8 %
Eosinophils Absolute: 358 {cells}/uL (ref 15–500)
Eosinophils Relative: 5.6 %
HCT: 46.8 % (ref 38.5–50.0)
Hemoglobin: 16.1 g/dL (ref 13.2–17.1)
MCH: 32.7 pg (ref 27.0–33.0)
MCHC: 34.4 g/dL (ref 32.0–36.0)
MCV: 94.9 fL (ref 80.0–100.0)
MPV: 10.3 fL (ref 7.5–12.5)
Monocytes Relative: 10.7 %
Neutro Abs: 3725 {cells}/uL (ref 1500–7800)
Neutrophils Relative %: 58.2 %
Platelets: 206 10*3/uL (ref 140–400)
RBC: 4.93 10*6/uL (ref 4.20–5.80)
RDW: 12.8 % (ref 11.0–15.0)
Total Lymphocyte: 24.7 %
WBC: 6.4 10*3/uL (ref 3.8–10.8)

## 2023-08-12 LAB — COMPREHENSIVE METABOLIC PANEL WITH GFR
AG Ratio: 1.6 (calc) (ref 1.0–2.5)
ALT: 27 U/L (ref 9–46)
AST: 21 U/L (ref 10–35)
Albumin: 4.2 g/dL (ref 3.6–5.1)
Alkaline phosphatase (APISO): 74 U/L (ref 35–144)
BUN: 11 mg/dL (ref 7–25)
CO2: 29 mmol/L (ref 20–32)
Calcium: 9.2 mg/dL (ref 8.6–10.3)
Chloride: 106 mmol/L (ref 98–110)
Creat: 0.91 mg/dL (ref 0.70–1.35)
Globulin: 2.7 g/dL (ref 1.9–3.7)
Glucose, Bld: 95 mg/dL (ref 65–99)
Potassium: 4.3 mmol/L (ref 3.5–5.3)
Sodium: 141 mmol/L (ref 135–146)
Total Bilirubin: 0.7 mg/dL (ref 0.2–1.2)
Total Protein: 6.9 g/dL (ref 6.1–8.1)
eGFR: 96 mL/min/{1.73_m2} (ref >=60)

## 2023-08-12 LAB — QUANTIFERON-TB GOLD PLUS
Mitogen-NIL: 6.4 [IU]/mL
NIL: 0.02 [IU]/mL
QuantiFERON-TB Gold Plus: NEGATIVE
TB1-NIL: 0 [IU]/mL
TB2-NIL: 0 [IU]/mL

## 2023-08-14 NOTE — Progress Notes (Signed)
 TB gold negative

## 2023-08-14 NOTE — Telephone Encounter (Signed)
 Last Fill: 05/21/2023  Labs: 08/09/2023 CBC and CMP WNL   Next Visit: 09/18/2023  Last Visit: 04/20/2023  DX: Rheumatoid arthritis involving multiple sites with positive rheumatoid factor   Current Dose per office note 04/20/2023: methotrexate  6 tablets p.o. weekly   Okay to refill Methotrexate ?

## 2023-09-03 ENCOUNTER — Other Ambulatory Visit: Payer: Self-pay | Admitting: Physician Assistant

## 2023-09-04 NOTE — Telephone Encounter (Signed)
 Last Fill: 06/19/2022  Next Visit: 09/18/2023  Last Visit: 04/20/2023  Dx:  Rheumatoid arthritis involving multiple sites with positive rheumatoid factor   Current Dose per office note on 04/20/2023: folic acid  2 mg p.o. daily   Okay to refill Folic Acid ?

## 2023-09-04 NOTE — Progress Notes (Unsigned)
 Office Visit Note  Patient: Thomas Davidson             Date of Birth: 1960/09/23           MRN: [id number]             PCP: Nedra Tinnie LABOR, NP Referring: Nedra Tinnie LABOR, NP Visit Date: 09/18/2023 Occupation: @GUAROCC @  Subjective:  Medication monitoring  History of Present Illness: Thomas Davidson is a 63 y.o. male with history of seropositive rheumatoid arthritis.  Patient remains on Hyrimoz  40 mg sq injections every 14 days, methotrexate  6 tablets p.o. weekly, folic acid  2 mg p.o. daily.  He is tolerating both medications without any side effects or injection site reactions.  He denies any recent gaps in therapy.  He currently has an upper respiratory tract infection and is planning to hold his dose of methotrexate  tonight and his dose of Hyrimoz  tomorrow to let his symptoms resolve.  He denies any signs or symptoms of a flare.  He has not been experiencing any morning stiffness, nocturnal pain, or difficulty performing ADLs.  He denies any increased joint pain or joint swelling at this time. He denies any new medical conditions.     Activities of Daily Living:  Patient denies any morning stiffness  Patient Denies nocturnal pain.  Difficulty dressing/grooming: Denies Difficulty climbing stairs: Denies Difficulty getting out of chair: Denies Difficulty using hands for taps, buttons, cutlery, and/or writing: Denies  Review of Systems  Constitutional:  Positive for fatigue.  HENT:  Negative for mouth sores and mouth dryness.   Eyes:  Negative for dryness.  Respiratory:  Negative for shortness of breath.   Cardiovascular:  Negative for chest pain and palpitations.  Gastrointestinal:  Negative for blood in stool, constipation and diarrhea.  Endocrine: Negative for increased urination.  Genitourinary:  Negative for involuntary urination.  Musculoskeletal:  Negative for joint pain, gait problem, joint pain, joint swelling, myalgias, muscle weakness, morning stiffness, muscle tenderness  and myalgias.  Skin:  Negative for color change, rash, hair loss and sensitivity to sunlight.  Allergic/Immunologic: Negative for susceptible to infections.  Neurological:  Negative for dizziness and headaches.  Hematological:  Negative for swollen glands.  Psychiatric/Behavioral:  Negative for depressed mood and sleep disturbance. The patient is not nervous/anxious.     PMFS History:  Patient Active Problem List   Diagnosis Date Noted   Routine general medical examination at a health care facility 05/24/2023   Rheumatoid arthritis involving both hands with positive rheumatoid factor (HCC) 04/27/2022   Arthralgia of both hands 06/29/2021   Pain in both upper arms 04/25/2021   Elevated blood pressure reading 04/25/2021   History of BPH 06/12/2016   Neuroma 04/05/2015   Allergic rhinitis 11/19/2008    Past Medical History:  Diagnosis Date   Allergy    Asthma    as child   BPH (benign prostatic hyperplasia)    Rheumatoid arthritis (HCC)     Family History  Problem Relation Age of Onset   Healthy Mother    Heart disease Father    Diabetes Maternal Grandfather    Healthy Son    Healthy Daughter    Healthy Daughter    Colon cancer Neg Hx    Past Surgical History:  Procedure Laterality Date   NO PAST SURGERIES     Social History   Social History Narrative    Married, 3 children, 6 grandchildren. Works as a Merchandiser, retail at [employer].  Immunization History  Administered Date(s) Administered   Influenza Split 02/05/2012   Influenza Whole 12/19/2006, 12/18/2008   Influenza,inj,Quad PF,6+ Mos 02/05/2013, 01/26/2014   Influenza-Unspecified 10/19/2014, 01/23/2021, 01/18/2022, 12/19/2022   PNEUMOCOCCAL CONJUGATE-20 05/24/2023   Td 10/20/2002   Tdap 02/05/2013, 05/24/2023   Zoster Recombinant(Shingrix ) 04/25/2021     Objective: Vital Signs: BP (!) 140/76 (BP Location: Left Arm, Patient Position: Sitting, Cuff Size: Normal)   Pulse (!) 121   Resp 15   Ht 6' 1  (1.854 m)   Wt 209 lb (94.8 kg)   BMI 27.57 kg/m    Physical Exam Vitals and nursing note reviewed.  Constitutional:      Appearance: He is well-developed.  HENT:     Head: Normocephalic and atraumatic.   Eyes:     Conjunctiva/sclera: Conjunctivae normal.     Pupils: Pupils are equal, round, and reactive to light.    Cardiovascular:     Rate and Rhythm: Normal rate and regular rhythm.     Heart sounds: Normal heart sounds.  Pulmonary:     Effort: Pulmonary effort is normal.     Breath sounds: Normal breath sounds.  Abdominal:     General: Bowel sounds are normal.     Palpations: Abdomen is soft.   Musculoskeletal:     Cervical back: Normal range of motion and neck supple.   Skin:    General: Skin is warm and dry.     Capillary Refill: Capillary refill takes less than 2 seconds.   Neurological:     Mental Status: He is alert and oriented to person, place, and time.   Psychiatric:        Behavior: Behavior normal.      Musculoskeletal Exam: C-spine, thoracic spine, lumbar spine have good range of motion.  Shoulder joints, elbow joints, wrist joints, MCPs, PIPs, DIPs have good range of motion with no synovitis.  Complete fist formation bilaterally.  Hip joints have good range of motion with no groin pain.  Knee joints have good range of motion with no warmth or effusion.  Ankle joints have good range of motion.    CDAI Exam: CDAI Score: -- Patient Global: --; Provider Global: -- Swollen: --; Tender: -- Joint Exam 09/18/2023   No joint exam has been documented for this visit   There is currently no information documented on the homunculus. Go to the Rheumatology activity and complete the homunculus joint exam.  Investigation: No additional findings.  Imaging: No results found.  Recent Labs: Lab Results  Component Value Date   WBC 6.4 08/09/2023   HGB 16.1 08/09/2023   PLT 206 08/09/2023   NA 141 08/09/2023   K 4.3 08/09/2023   CL 106 08/09/2023   CO2  29 08/09/2023   GLUCOSE 95 08/09/2023   BUN 11 08/09/2023   CREATININE 0.91 08/09/2023   BILITOT 0.7 08/09/2023   ALKPHOS 75 04/27/2022   AST 21 08/09/2023   ALT 27 08/09/2023   PROT 6.9 08/09/2023   ALBUMIN 4.0 04/27/2022   CALCIUM 9.2 08/09/2023   GFRAA 104 01/03/2007   QFTBGOLDPLUS NEGATIVE 08/09/2023    Speciality Comments: MTX 06/2021 Humira  07/23  Procedures:  No procedures performed Allergies: Bee venom   Assessment / Plan:     Visit Diagnoses: Rheumatoid arthritis involving multiple sites with positive rheumatoid factor (HCC) - Positive RF, positive anti-CCP, ANA negative, synovitis involving multiple joints: He has no tenderness or synovitis on examination today.  He has not had any signs or symptoms  of a rheumatoid arthritis flare.  He has clinically been doing well on Hyrimoz  40 mg sq injections every 14 days and methotrexate  6 tablets by mouth once weekly.  He is tolerating combination therapy without any side effects.  At his last office visit there was discussion of spacing the dosing of Hyrimoz  to every 21 days since he has clinically been doing well.  He has not been experiencing any morning stiffness, nocturnal pain, or difficulty with ADLs.  Patient would like to try spacing the dosing of hyrimoz  to every 21 days.  He will remain on methotrexate  and folic acid  as prescribed.  He was advised to notify us  if he develops any new or worsening symptoms. He will follow-up in the office in 5 months or sooner if needed.  High risk medication use - Hyrimoz  40 mg sq injections every 21 days, methotrexate  6 tablets by mouth once weekly, folic acid  2 mg by mouth daily.  He plans to space Hyrimoz  to every 21 days due to clinically doing well.   CBC and CMP updated on 08/09/23.  His next lab work will be due in August and every 3 months.  TB gold negative on 08/09/23.  Lipid panel WNL on 05/16/23.  No recurrent infections. Discussed the importance of holding hyrimoz  if he develops  signs or symptoms of an infection and to resume once the infection has completely cleared.  Patient sees dermatology every January for skin cancer screening as encouraged.  Primary osteoarthritis of both hands: No tenderness or synovitis noted on examination today.  Complete fist formation bilaterally.  Chronic pain of both knees: He has good range of motion of both knee joints on examination today.  No warmth or effusion noted.  Primary osteoarthritis of both feet: He is not experiencing any discomfort in his feet at this time.  He has good range of motion of both ankle joints.  Other medical conditions are listed as follows:  Neuroma  History of BPH  Seasonal allergic rhinitis due to pollen  Orders: No orders of the defined types were placed in this encounter.  No orders of the defined types were placed in this encounter.    Follow-Up Instructions: Return in about 5 months (around 02/18/2024) for Rheumatoid arthritis.   Waddell CHRISTELLA Craze, PA-C  Note - This record has been created using Dragon software.  Chart creation errors have been sought, but may not always  have been located. Such creation errors do not reflect on  the standard of medical care.

## 2023-09-18 ENCOUNTER — Ambulatory Visit: Payer: 59 | Attending: Physician Assistant | Admitting: Physician Assistant

## 2023-09-18 ENCOUNTER — Encounter: Payer: Self-pay | Admitting: Physician Assistant

## 2023-09-18 VITALS — BP 137/87 | HR 112 | Resp 15 | Ht 73.0 in | Wt 209.0 lb

## 2023-09-18 DIAGNOSIS — M19072 Primary osteoarthritis, left ankle and foot: Secondary | ICD-10-CM

## 2023-09-18 DIAGNOSIS — M0579 Rheumatoid arthritis with rheumatoid factor of multiple sites without organ or systems involvement: Secondary | ICD-10-CM | POA: Diagnosis not present

## 2023-09-18 DIAGNOSIS — J301 Allergic rhinitis due to pollen: Secondary | ICD-10-CM

## 2023-09-18 DIAGNOSIS — M25562 Pain in left knee: Secondary | ICD-10-CM

## 2023-09-18 DIAGNOSIS — M19071 Primary osteoarthritis, right ankle and foot: Secondary | ICD-10-CM

## 2023-09-18 DIAGNOSIS — M19041 Primary osteoarthritis, right hand: Secondary | ICD-10-CM

## 2023-09-18 DIAGNOSIS — Z79899 Other long term (current) drug therapy: Secondary | ICD-10-CM

## 2023-09-18 DIAGNOSIS — M19042 Primary osteoarthritis, left hand: Secondary | ICD-10-CM

## 2023-09-18 DIAGNOSIS — Z87438 Personal history of other diseases of male genital organs: Secondary | ICD-10-CM

## 2023-09-18 DIAGNOSIS — G8929 Other chronic pain: Secondary | ICD-10-CM

## 2023-09-18 DIAGNOSIS — M25561 Pain in right knee: Secondary | ICD-10-CM

## 2023-09-18 DIAGNOSIS — D3610 Benign neoplasm of peripheral nerves and autonomic nervous system, unspecified: Secondary | ICD-10-CM

## 2023-09-18 NOTE — Patient Instructions (Signed)
 Standing Labs We placed an order today for your standing lab work.   Please have your standing labs drawn in August and every 3 months   Please have your labs drawn 2 weeks prior to your appointment so that the provider can discuss your lab results at your appointment, if possible.  Please note that you may see your imaging and lab results in MyChart before we have reviewed them. We will contact you once all results are reviewed. Please allow our office up to 72 hours to thoroughly review all of the results before contacting the office for clarification of your results.  WALK-IN LAB HOURS  Monday through Thursday from 8:00 am -12:30 pm and 1:00 pm-4:30 pm and Friday from 8:00 am-12:00 pm.  Patients with office visits requiring labs will be seen before walk-in labs.  You may encounter longer than normal wait times. Please allow additional time. Wait times may be shorter on  Monday and Thursday afternoons.  We do not book appointments for walk-in labs. We appreciate your patience and understanding with our staff.   Labs are drawn by [facility]. Please bring your co-pay at the time of your lab draw.  You may receive a bill from [facility] for your lab work.  Please note if you are on Hydroxychloroquine and and an order has been placed for a Hydroxychloroquine level,  you will need to have it drawn 4 hours or more after your last dose.  If you wish to have your labs drawn at another location, please call the office 24 hours in advance so we can fax the orders.  The office is located at [address], [place], [place] [address]   If you have any questions regarding directions or hours of operation,  please call [phone].   As a reminder, please drink plenty of water prior to coming for your lab work. Thanks!

## 2023-09-29 ENCOUNTER — Telehealth: Admitting: Nurse Practitioner

## 2023-09-29 DIAGNOSIS — R0981 Nasal congestion: Secondary | ICD-10-CM

## 2023-09-29 DIAGNOSIS — J0110 Acute frontal sinusitis, unspecified: Secondary | ICD-10-CM | POA: Diagnosis not present

## 2023-09-29 DIAGNOSIS — R0989 Other specified symptoms and signs involving the circulatory and respiratory systems: Secondary | ICD-10-CM

## 2023-09-29 MED ORDER — AMOXICILLIN-POT CLAVULANATE 875-125 MG PO TABS: 1.0000 | ORAL_TABLET | Freq: Two times a day (BID) | ORAL | 0 refills | Status: AC

## 2023-09-29 MED ORDER — FLUTICASONE PROPIONATE 50 MCG/ACT NA SUSP: 2.0000 | Freq: Every day | NASAL | 0 refills | Status: AC

## 2023-09-29 NOTE — Progress Notes (Signed)
 I have spent 5 minutes in review of e-visit questionnaire, review and updating patient chart, medical decision making and response to patient.   Claiborne Rigg, NP

## 2023-09-29 NOTE — Progress Notes (Signed)

## 2023-10-03 ENCOUNTER — Telehealth: Payer: Self-pay

## 2023-10-03 NOTE — Telephone Encounter (Signed)
 LMOM for patient to contact the office.  Recommended that patient should hold his methotrexate  while taking Augmentin , per Waddell Craze.

## 2023-10-03 NOTE — Telephone Encounter (Signed)
 Patient returned call to the office. Patient states he is holding his injection while on Augmentin .

## 2023-10-24 IMAGING — DX DG FOOT COMPLETE 3+V*R*
3 series · 3 of 3 positions shown · non-contrast
Comparison: 04/05/2015.

CLINICAL DATA: Right foot pain today, no injury; pain woke pt up
this morning, burning pain from mid foot to toes. Veins on top of
the foot were dilated this morning per pt. No hx of gout

EXAM:
RIGHT FOOT COMPLETE - 3+ VIEW

[foot ap]
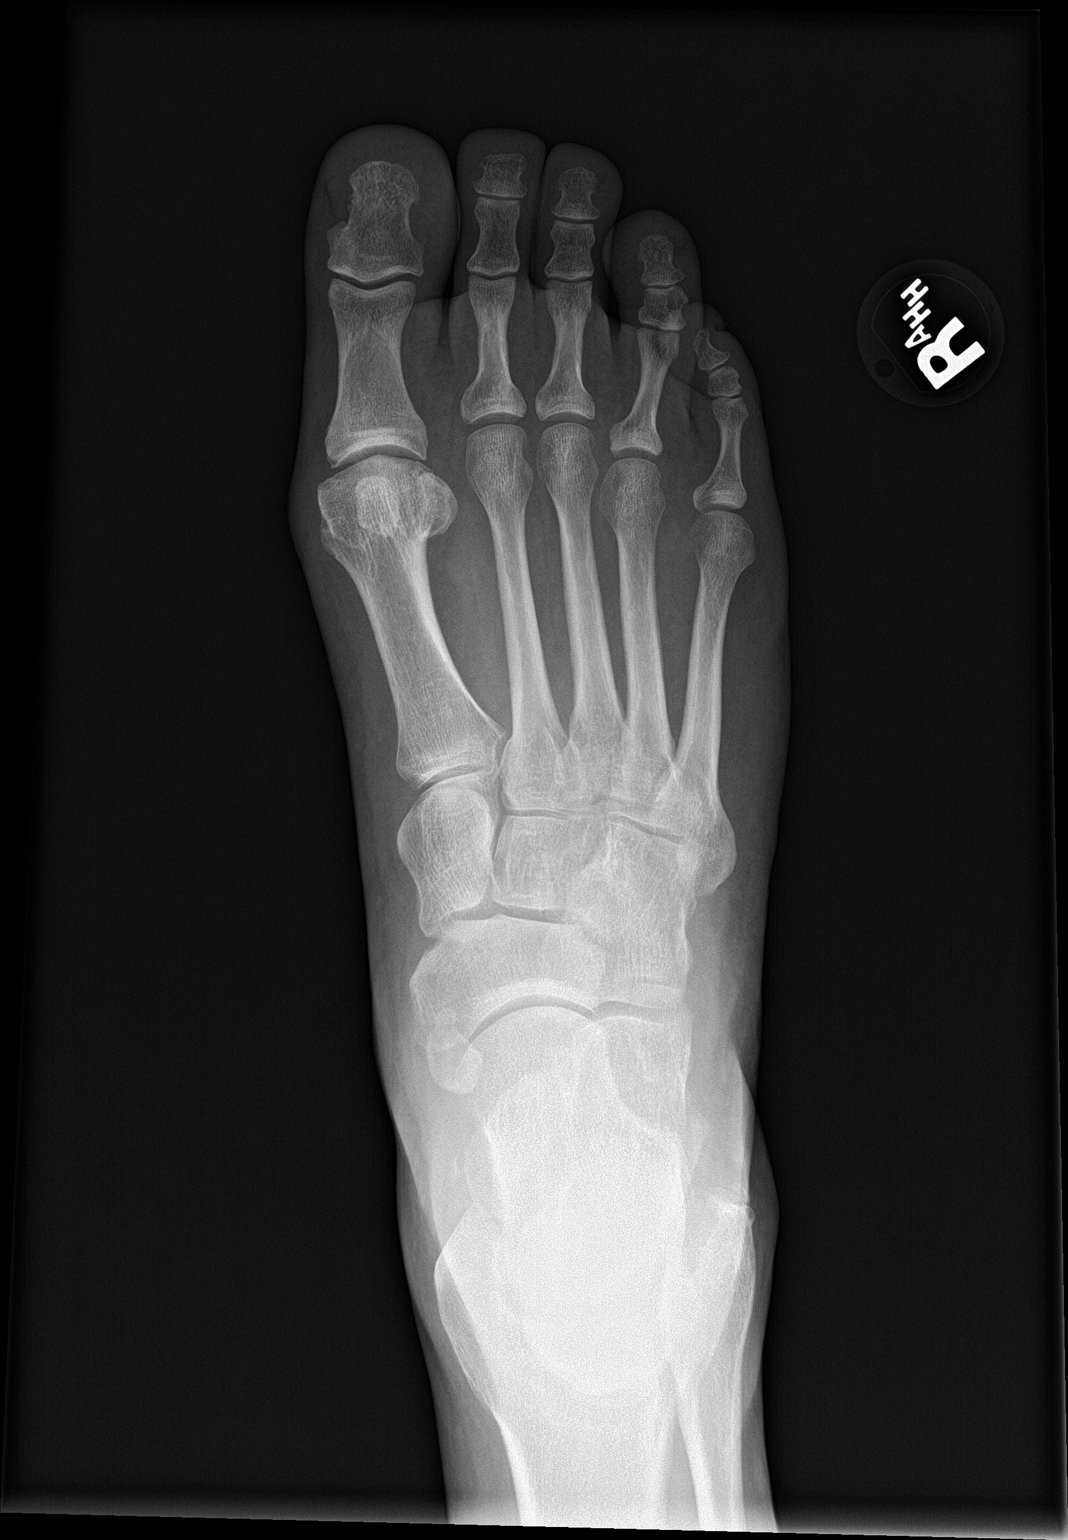

[foot obl]
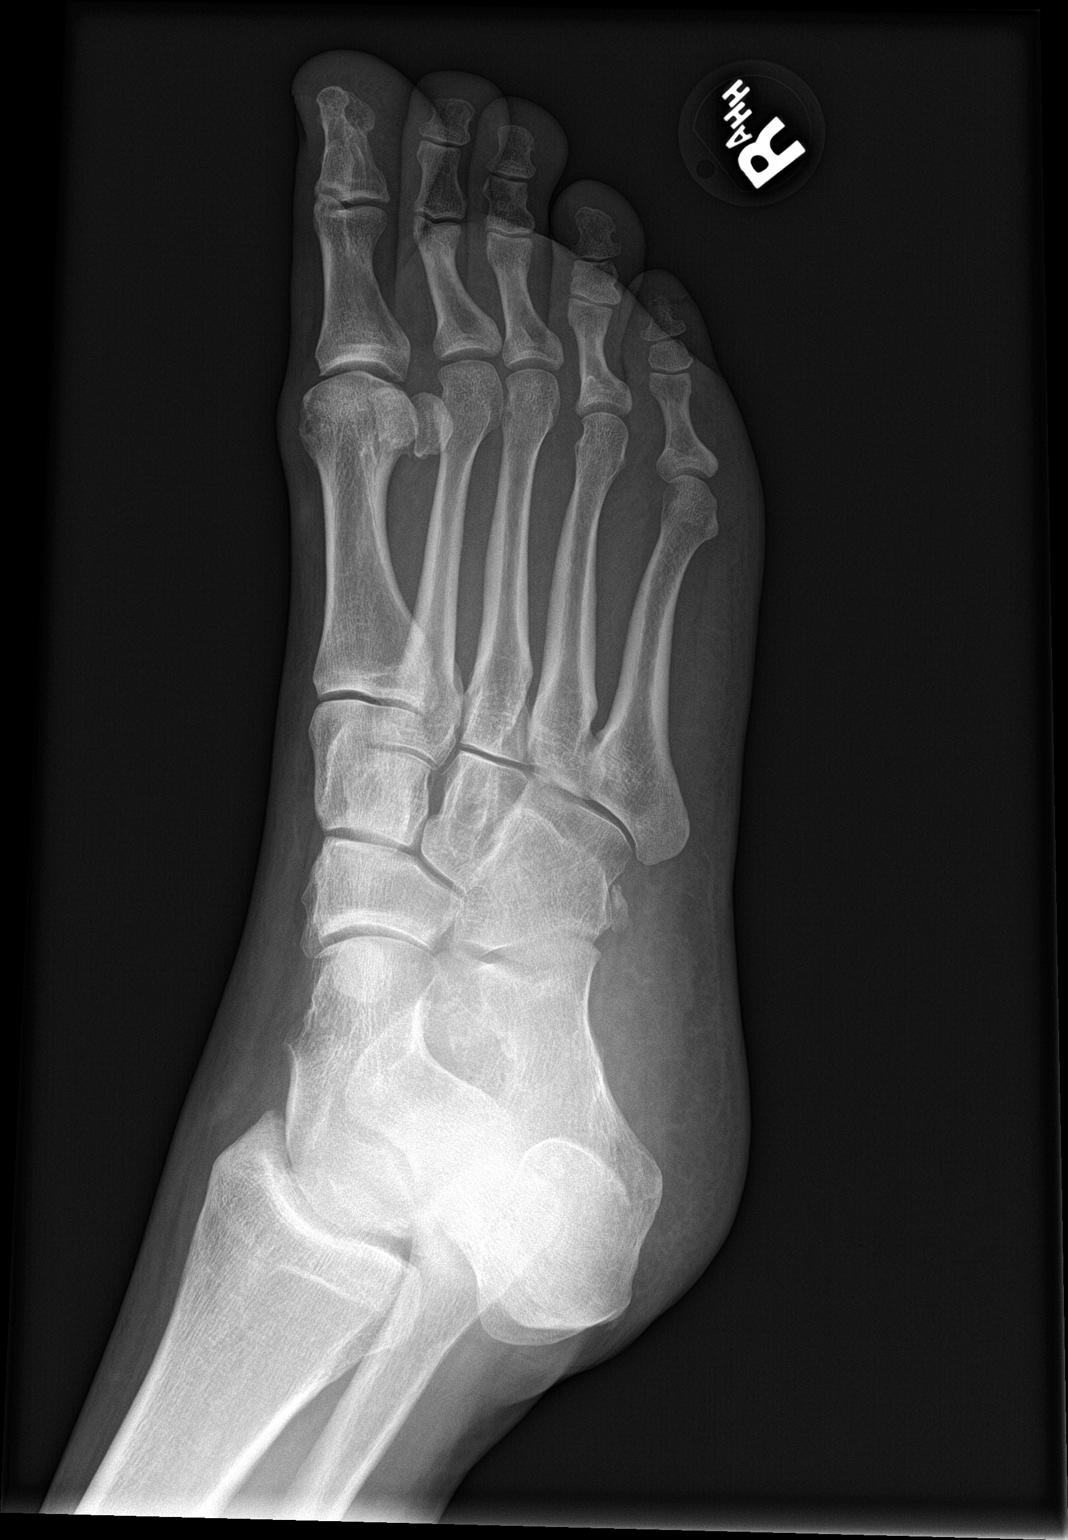

[foot lat]
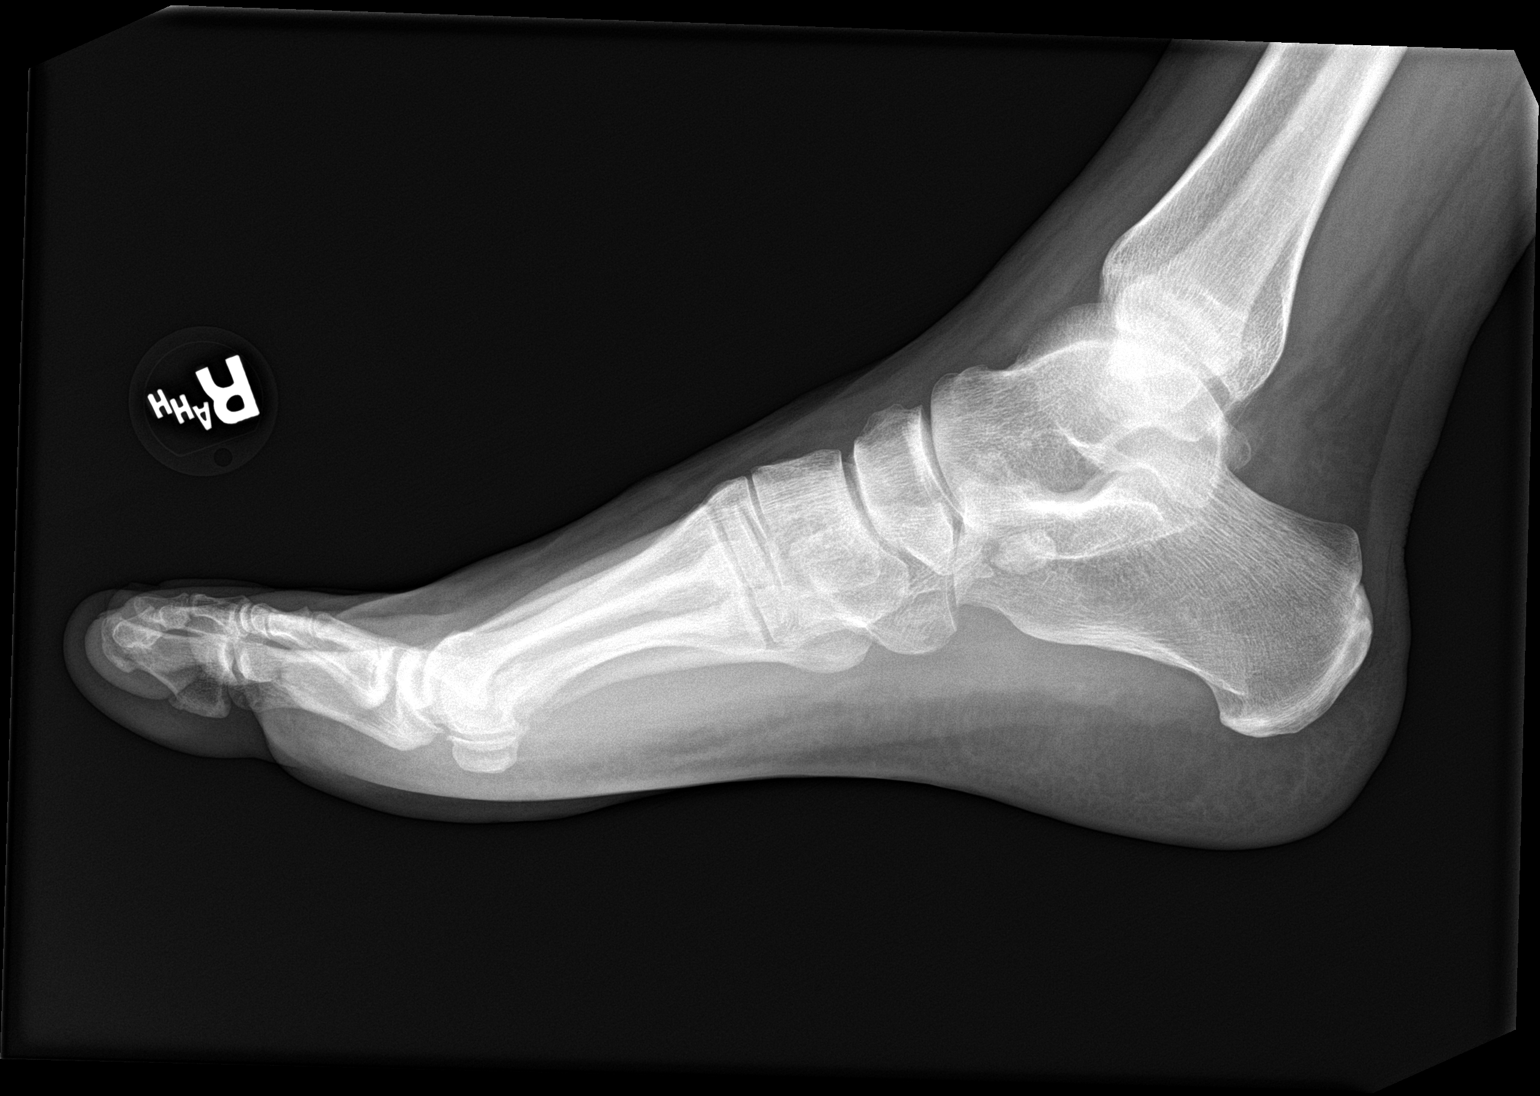

[3 of 3 positions shown; findings below may reference images not displayed]

FINDINGS: There is no evidence of fracture or dislocation. There is no
evidence of arthropathy or other focal bone abnormality. Soft
tissues are unremarkable.
IMPRESSION: Negative.

## 2023-11-05 ENCOUNTER — Other Ambulatory Visit: Payer: Self-pay | Admitting: Physician Assistant

## 2023-11-05 NOTE — Telephone Encounter (Signed)
 Last Fill: 08/07/2023  Labs: 08/09/2023 CBC and CMP WNL   TB Gold: 08/09/2023 TB gold negative   Next Visit: 02/18/2024  Last Visit: 09/18/2023  IK:Myzlfjunpi arthritis involving multiple sites with positive rheumatoid factor (HCC)   Current Dose per office note 09/18/2023: Hyrimoz  40 mg sq injections every 14 days, Patient would like to try spacing the dosing of hyrimoz  to every 21 days.   Changed the prescription to reflect every 21 days. Please review.   Okay to refill Hyrimoz ?

## 2023-11-12 ENCOUNTER — Telehealth: Payer: Self-pay | Admitting: Pharmacist

## 2023-11-12 NOTE — Telephone Encounter (Signed)
 Submitted a Prior Authorization RENEWAL request to [facility] for HYRIMOZ  via fax. Will update once we receive a response.  Case # [id number] Phone: [phone] Fax: [phone]

## 2023-11-12 NOTE — Telephone Encounter (Signed)
 Received notification from [facility] regarding a prior authorization for HYRIMOZ . Authorization has been APPROVED from 11/12/2023 to 11/11/2024. Approval letter sent to scan center.  Authorization # [id number]  Sherry Pennant, PharmD, MPH, BCPS, CPP Clinical Pharmacist (Rheumatology and Pulmonology)

## 2023-11-13 ENCOUNTER — Other Ambulatory Visit: Payer: Self-pay

## 2023-11-13 DIAGNOSIS — Z79899 Other long term (current) drug therapy: Secondary | ICD-10-CM

## 2023-11-13 LAB — COMPREHENSIVE METABOLIC PANEL WITH GFR
AG Ratio: 1.3 (calc) (ref 1.0–2.5)
ALT: 18 U/L (ref 9–46)
AST: 14 U/L (ref 10–35)
Albumin: 4 g/dL (ref 3.6–5.1)
Alkaline phosphatase (APISO): 87 U/L (ref 35–144)
BUN: 9 mg/dL (ref 7–25)
CO2: 29 mmol/L (ref 20–32)
Calcium: 9 mg/dL (ref 8.6–10.3)
Chloride: 103 mmol/L (ref 98–110)
Creat: 1 mg/dL (ref 0.70–1.35)
Globulin: 3.2 g/dL (ref 1.9–3.7)
Glucose, Bld: 98 mg/dL (ref 65–99)
Potassium: 4.2 mmol/L (ref 3.5–5.3)
Sodium: 140 mmol/L (ref 135–146)
Total Bilirubin: 0.6 mg/dL (ref 0.2–1.2)
Total Protein: 7.2 g/dL (ref 6.1–8.1)
eGFR: 86 mL/min/1.73m2 (ref >=60)

## 2023-11-13 LAB — CBC WITH DIFFERENTIAL/PLATELET
Absolute Lymphocytes: 2023 {cells}/uL (ref 850–3900)
Absolute Monocytes: 655 {cells}/uL (ref 200–950)
Basophils Absolute: 79 {cells}/uL (ref 0–200)
Basophils Relative: 1.1 %
Eosinophils Absolute: 994 {cells}/uL — ABNORMAL HIGH (ref 15–500)
Eosinophils Relative: 13.8 %
HCT: 46.1 % (ref 38.5–50.0)
Hemoglobin: 15.8 g/dL (ref 13.2–17.1)
MCH: 32.3 pg (ref 27.0–33.0)
MCHC: 34.3 g/dL (ref 32.0–36.0)
MCV: 94.3 fL (ref 80.0–100.0)
MPV: 9.9 fL (ref 7.5–12.5)
Monocytes Relative: 9.1 %
Neutro Abs: 3449 {cells}/uL (ref 1500–7800)
Neutrophils Relative %: 47.9 %
Platelets: 228 Thousand/uL (ref 140–400)
RBC: 4.89 Million/uL (ref 4.20–5.80)
RDW: 12.8 % (ref 11.0–15.0)
Total Lymphocyte: 28.1 %
WBC: 7.2 Thousand/uL (ref 3.8–10.8)

## 2023-11-14 ENCOUNTER — Ambulatory Visit: Payer: Self-pay | Admitting: Physician Assistant

## 2023-11-14 NOTE — Progress Notes (Signed)
 CMP WNL Absolute eosinophils are slightly elevated-may be due to allergies. Rest of CBC WNL.   No change in therapy.

## 2023-11-22 ENCOUNTER — Encounter: Payer: Self-pay | Admitting: Nurse Practitioner

## 2023-11-22 DIAGNOSIS — R0989 Other specified symptoms and signs involving the circulatory and respiratory systems: Secondary | ICD-10-CM

## 2023-11-23 MED ORDER — MONTELUKAST SODIUM 10 MG PO TABS: 10.0000 mg | ORAL_TABLET | Freq: Every day | ORAL | 2 refills | Status: AC

## 2023-11-23 NOTE — Telephone Encounter (Signed)
 Requesting: montelukast  (SINGULAIR ) 10 MG tablet  Last Visit: 05/24/2023 Next Visit: Visit date not found Last Refill: 06/25/2022  Please Advise

## 2023-12-10 ENCOUNTER — Other Ambulatory Visit: Payer: Self-pay | Admitting: Physician Assistant

## 2023-12-10 DIAGNOSIS — M0579 Rheumatoid arthritis with rheumatoid factor of multiple sites without organ or systems involvement: Secondary | ICD-10-CM

## 2023-12-10 DIAGNOSIS — Z79899 Other long term (current) drug therapy: Secondary | ICD-10-CM

## 2023-12-10 NOTE — Telephone Encounter (Signed)
 Last Fill: 08/14/2023  Labs: 11/13/2023 CMP WNL Absolute eosinophils are slightly elevated-may be due to allergies. Rest of CBC WNL.   No change in therapy.  Next Visit: 02/18/2024  Last Visit: 09/18/2023  DX: Rheumatoid arthritis involving multiple sites with positive rheumatoid factor (HCC)   Current Dose per office note 09/18/2023: methotrexate  6 tablets by mouth once weekly   Okay to refill Methotrexate ?

## 2023-12-17 ENCOUNTER — Other Ambulatory Visit: Payer: Self-pay | Admitting: Physician Assistant

## 2023-12-17 NOTE — Telephone Encounter (Signed)
 Last Fill: 11/05/2023 (30 day supply)  Labs: 11/13/2023 CMP WNL Absolute eosinophils are slightly elevated-may be due to allergies. Rest of CBC WNL.   TB Gold: 08/09/2023 Neg    Next Visit: 02/18/2024  Last Visit: 09/18/2023  DX: Rheumatoid arthritis involving multiple sites with positive rheumatoid factor   Current Dose per office note 09/18/2023: Hyrimoz  40 mg sq injections every 14 days, Patient would like to try spacing the dosing of hyrimoz  to every 21 days.   Okay to refill Hyrimoz ?

## 2024-01-29 ENCOUNTER — Other Ambulatory Visit: Payer: Self-pay | Admitting: Physician Assistant

## 2024-01-29 NOTE — Telephone Encounter (Signed)
 Last Fill: 12/17/2023  Labs: 11/13/2023 CMP WNL Absolute eosinophils are slightly elevated-may be due to allergies. Rest of CBC WNL.   No change in therapy.  TB Gold: 08/09/2023 negative    Next Visit: 02/18/2024  Last Visit: 09/18/2023  IK:Myzlfjunpi arthritis involving multiple sites with positive rheumatoid factor   Current Dose per office note on 09/18/2023: Hyrimoz  40 mg sq injections every 21 days   Okay to refill Hyrimoz ?

## 2024-02-04 NOTE — Progress Notes (Unsigned)
 Office Visit Note  Patient: Thomas Davidson             Date of Birth: 09-01-1960           MRN: [id number]             PCP: Nedra Tinnie LABOR, NP Referring: Nedra Tinnie LABOR, NP Visit Date: 02/18/2024 Occupation: SCHEDULER SPECI  Subjective:  Medication monitoring  History of Present Illness: Thomas Davidson is a 64 y.o. male with history of seropositive rheumatoid arthritis and osteoarthritis.  Patient remains on  Hyrimoz  40 mg sq injections every 21 days, methotrexate  6 tablets by mouth once weekly, folic acid  2 mg by mouth daily.  He is tolerating combination therapy without any side effects and has not had any gaps in therapy.  He denies any signs or symptoms of a rheumatoid arthritis flare.  His morning stiffness has been lasting 5 to 10 minutes daily.  He has not had any nocturnal pain.  He denies any recent or recurrent infections.  He denies any new medical conditions.  Activities of Daily Living:  Patient reports morning stiffness for 5-10 minutes.   Patient Denies nocturnal pain.  Difficulty dressing/grooming: Denies Difficulty climbing stairs: Denies Difficulty getting out of chair: Denies Difficulty using hands for taps, buttons, cutlery, and/or writing: Denies  Review of Systems  Constitutional:  Positive for fatigue.  HENT:  Positive for mouth dryness. Negative for mouth sores.   Eyes:  Negative for dryness.  Respiratory:  Negative for shortness of breath.   Cardiovascular:  Negative for chest pain and palpitations.  Gastrointestinal:  Negative for blood in stool, constipation and diarrhea.  Endocrine: Negative for increased urination.  Genitourinary:  Positive for involuntary urination.  Musculoskeletal:  Positive for morning stiffness. Negative for joint pain, gait problem, joint pain, joint swelling, myalgias, muscle weakness, muscle tenderness and myalgias.  Skin:  Negative for color change, rash, hair loss and sensitivity to sunlight.  Allergic/Immunologic: Negative  for susceptible to infections.  Neurological:  Negative for dizziness and headaches.  Hematological:  Negative for swollen glands.  Psychiatric/Behavioral:  Negative for depressed mood and sleep disturbance. The patient is not nervous/anxious.     PMFS History:  Patient Active Problem List   Diagnosis Date Noted   Routine general medical examination at a health care facility 05/24/2023   Rheumatoid arthritis involving both hands with positive rheumatoid factor (HCC) 04/27/2022   Arthralgia of both hands 06/29/2021   Pain in both upper arms 04/25/2021   Elevated blood pressure reading 04/25/2021   History of BPH 06/12/2016   Neuroma 04/05/2015   Allergic rhinitis 11/19/2008    Past Medical History:  Diagnosis Date   Allergy    Asthma    as child   BPH (benign prostatic hyperplasia)    Rheumatoid arthritis (HCC)     Family History  Problem Relation Age of Onset   Healthy Mother    Heart disease Father    Diabetes Maternal Grandfather    Healthy Son    Healthy Daughter    Healthy Daughter    Colon cancer Neg Hx    Past Surgical History:  Procedure Laterality Date   NO PAST SURGERIES     Social History   Tobacco Use   Smoking status: Never    Passive exposure: Never   Smokeless tobacco: Current    Types: Chew  Vaping Use   Vaping status: Never Used  Substance Use Topics   Alcohol use: No   Drug  use: No   Social History   Social History Narrative    Married, 3 children, 6 grandchildren. Works as a merchandiser, retail at [employer].        Immunization History  Administered Date(s) Administered   Influenza Split 02/05/2012   Influenza Whole 12/19/2006, 12/18/2008   Influenza,inj,Quad PF,6+ Mos 02/05/2013, 01/26/2014   Influenza-Unspecified 10/19/2014, 01/23/2021, 01/18/2022, 12/19/2022   PNEUMOCOCCAL CONJUGATE-20 05/24/2023   Td 10/20/2002   Tdap 02/05/2013, 05/24/2023   Zoster Recombinant(Shingrix ) 04/25/2021     Objective: Vital Signs: BP (!) 142/86    Pulse 94   Temp 98.3 F (36.8 C)   Resp 14   Ht 6' 1 (1.854 m)   Wt 213 lb 12.8 oz (97 kg)   BMI 28.21 kg/m    Physical Exam Vitals and nursing note reviewed.  Constitutional:      Appearance: He is well-developed.  HENT:     Head: Normocephalic and atraumatic.  Eyes:     Conjunctiva/sclera: Conjunctivae normal.     Pupils: Pupils are equal, round, and reactive to light.  Cardiovascular:     Rate and Rhythm: Normal rate and regular rhythm.     Heart sounds: Normal heart sounds.  Pulmonary:     Effort: Pulmonary effort is normal.     Breath sounds: Normal breath sounds.  Abdominal:     General: Bowel sounds are normal.     Palpations: Abdomen is soft.  Musculoskeletal:     Cervical back: Normal range of motion and neck supple.  Skin:    General: Skin is warm and dry.     Capillary Refill: Capillary refill takes less than 2 seconds.  Neurological:     Mental Status: He is alert and oriented to person, place, and time.  Psychiatric:        Behavior: Behavior normal.      Musculoskeletal Exam: C-spine has limited range of motion with lateral rotation.  Thoracic spine and lumbar spine have good range of motion.  No midline spinal tenderness.  No SI joint tenderness.  Shoulder joints, elbow joints, wrist joints, MCPs, PIPs, DIPs have good range of motion with no synovitis.  PIP and DIP thickening consistent with osteoarthritis of both hands.  Complete fist formation bilaterally.  Hip joints have good range of motion with no groin pain.  Knee joints have good range of motion no warmth or effusion.  Ankle joints have good range of motion no tenderness or joint swelling.     CDAI Exam: CDAI Score: -- Patient Global: --; Provider Global: -- Swollen: --; Tender: -- Joint Exam 02/18/2024   No joint exam has been documented for this visit   There is currently no information documented on the homunculus. Go to the Rheumatology activity and complete the homunculus joint  exam.  Investigation: No additional findings.  Imaging: No results found.  Recent Labs: Lab Results  Component Value Date   WBC 7.2 11/13/2023   HGB 15.8 11/13/2023   PLT 228 11/13/2023   NA 140 11/13/2023   K 4.2 11/13/2023   CL 103 11/13/2023   CO2 29 11/13/2023   GLUCOSE 98 11/13/2023   BUN 9 11/13/2023   CREATININE 1.00 11/13/2023   BILITOT 0.6 11/13/2023   ALKPHOS 75 04/27/2022   AST 14 11/13/2023   ALT 18 11/13/2023   PROT 7.2 11/13/2023   ALBUMIN 4.0 04/27/2022   CALCIUM 9.0 11/13/2023   GFRAA 104 01/03/2007   QFTBGOLDPLUS NEGATIVE 08/09/2023    Speciality Comments: MTX 06/2021 Humira   07/23  Procedures:  No procedures performed Allergies: Bee venom   Assessment / Plan:     Visit Diagnoses: Rheumatoid arthritis involving multiple sites with positive rheumatoid factor (HCC) - Positive RF, positive anti-CCP, ANA negative, synovitis involving multiple joints: No tenderness or synovitis on examination today.  He has not had any signs or symptoms of a rheumatoid arthritis flare.  He has clinically been doing well on Hyrimoz  40 mg sq injections every 21 days, methotrexate  6 tablets by mouth once weekly, and folic acid  2 mg daily.  He is tolerating combination therapy without any side effects and has not had any gaps in therapy.  He has not noticed any new or worsening of symptoms since spacing the dosing of Hyrimoz .  No medication changes will be made at this time.  If he continues to do well clinically we can discuss reducing the dose of methotrexate  at the next follow-up visit.  He will notify us  if he develops any signs or symptoms of a flare.  He will follow-up in the office in 5 months or sooner if needed.  High risk medication use - Hyrimoz  40 mg sq injections every 21 days, methotrexate  6 tablets by mouth once weekly, folic acid  2 mg by mouth daily. CBC and CMP updated on 11/13/23. Orders for CBC and CMP released today.  TB gold negative on 08/09/23.  No recent or  recurrent infections.  Discussed the importance of holding hyrimoz  and methotrexate  if he develops signs or symptoms of an infection and to resume once the infection has completely cleared.   - Plan: CBC with Differential/Platelet, Comprehensive metabolic panel with GFR  Primary osteoarthritis of both hands: PIP and DIP thickening consistent with osteoarthritis of both hands.  No tenderness or synovitis noted.  Chronic pain of both knees: He has good range of motion of both knee joints on examination today.  No warmth or effusion noted.  Primary osteoarthritis of both feet: He has good range of motion of both ankle joints with no tenderness or joint swelling.  He is wearing proper fitting shoes.  Other medical conditions are listed as follows:  Neuroma  History of BPH  Seasonal allergic rhinitis due to pollen  Orders: Orders Placed This Encounter  Procedures   CBC with Differential/Platelet   Comprehensive metabolic panel with GFR   No orders of the defined types were placed in this encounter.    Follow-Up Instructions: Return in about 5 months (around 07/18/2024) for Rheumatoid arthritis.   Waddell CHRISTELLA Craze, PA-C  Note - This record has been created using Dragon software.  Chart creation errors have been sought, but may not always  have been located. Such creation errors do not reflect on  the standard of medical care.

## 2024-02-12 IMAGING — DX DG CHEST 2V
2 series · 2 of 2 positions shown · non-contrast
Comparison: None.

CLINICAL DATA: Immunosuppressive therapy for rheumatoid arthritis.
No chest complaints.

EXAM:
CHEST - 2 VIEW

[chest pa]
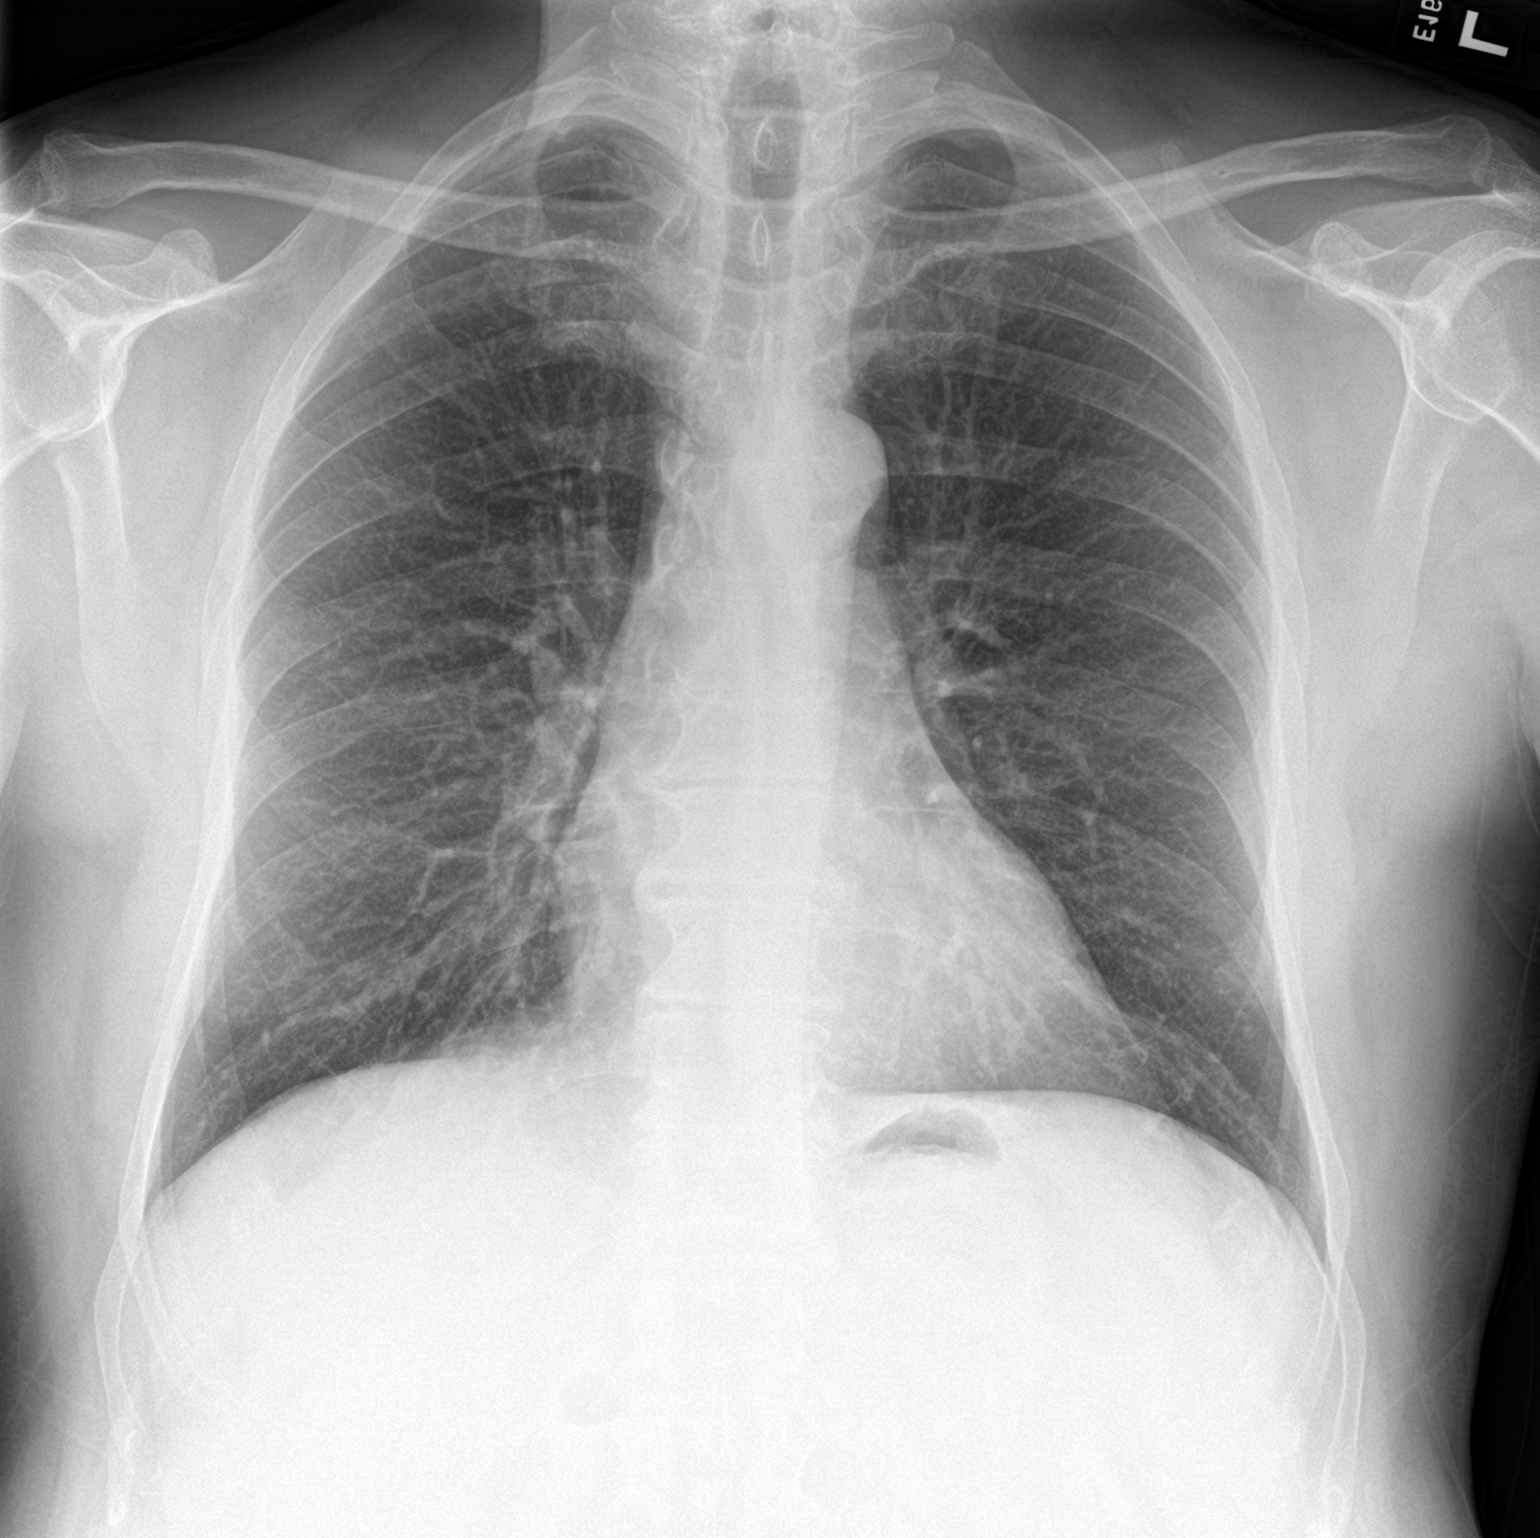

[chest lat]
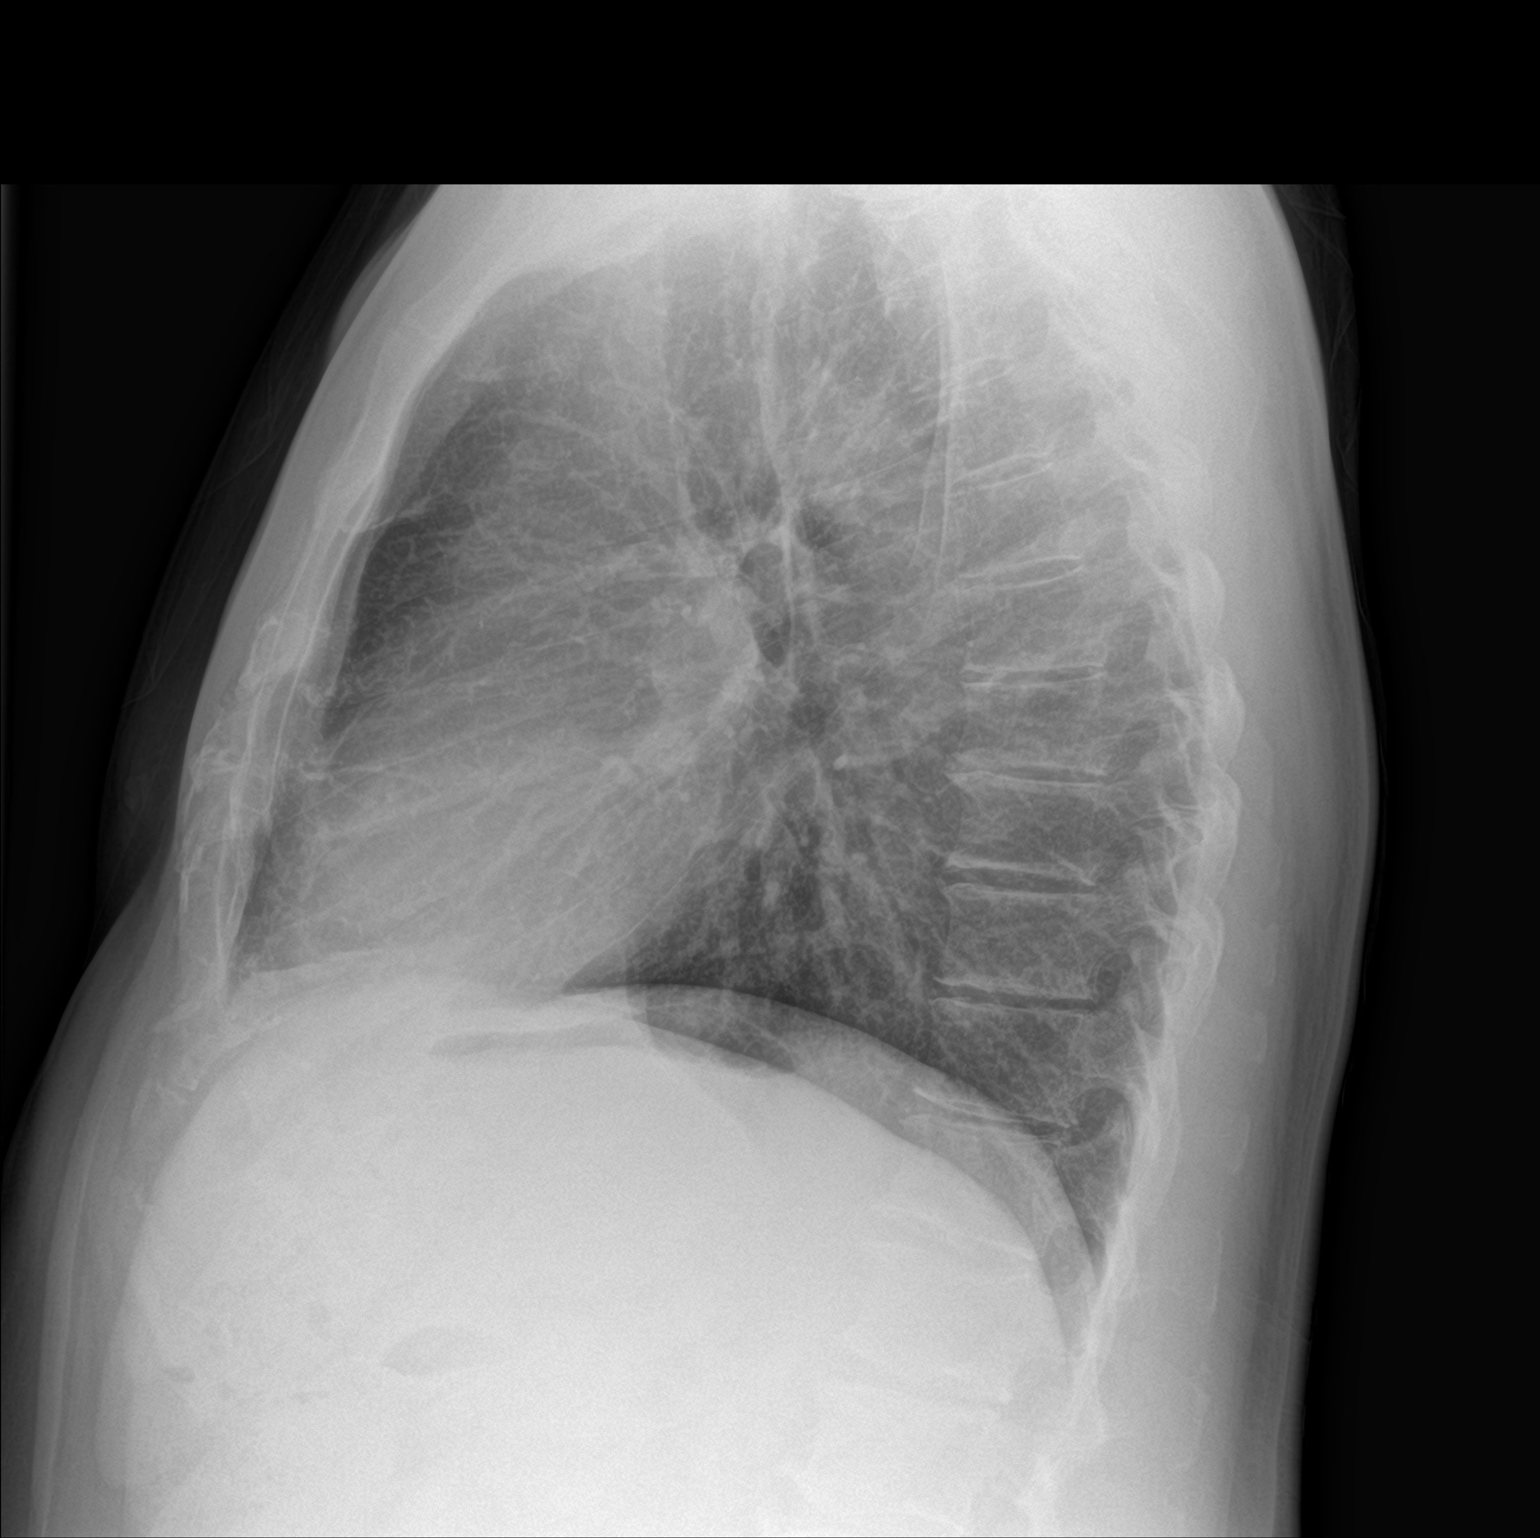

[2 of 2 positions shown; findings below may reference images not displayed]

FINDINGS: The heart size and mediastinal contours are within normal limits.
Both lungs are clear. The visualized skeletal structures are
unremarkable.
IMPRESSION: No active cardiopulmonary disease.

## 2024-02-18 ENCOUNTER — Ambulatory Visit: Payer: Self-pay | Admitting: Physician Assistant

## 2024-02-18 ENCOUNTER — Ambulatory Visit: Attending: Physician Assistant | Admitting: Physician Assistant

## 2024-02-18 ENCOUNTER — Encounter: Payer: Self-pay | Admitting: Physician Assistant

## 2024-02-18 VITALS — BP 138/87 | HR 93 | Temp 98.3°F | Resp 14 | Ht 73.0 in | Wt 213.8 lb

## 2024-02-18 DIAGNOSIS — M19072 Primary osteoarthritis, left ankle and foot: Secondary | ICD-10-CM

## 2024-02-18 DIAGNOSIS — M25562 Pain in left knee: Secondary | ICD-10-CM

## 2024-02-18 DIAGNOSIS — D3610 Benign neoplasm of peripheral nerves and autonomic nervous system, unspecified: Secondary | ICD-10-CM

## 2024-02-18 DIAGNOSIS — G8929 Other chronic pain: Secondary | ICD-10-CM

## 2024-02-18 DIAGNOSIS — M0579 Rheumatoid arthritis with rheumatoid factor of multiple sites without organ or systems involvement: Secondary | ICD-10-CM

## 2024-02-18 DIAGNOSIS — M25561 Pain in right knee: Secondary | ICD-10-CM | POA: Diagnosis not present

## 2024-02-18 DIAGNOSIS — M19042 Primary osteoarthritis, left hand: Secondary | ICD-10-CM

## 2024-02-18 DIAGNOSIS — J301 Allergic rhinitis due to pollen: Secondary | ICD-10-CM

## 2024-02-18 DIAGNOSIS — Z79899 Other long term (current) drug therapy: Secondary | ICD-10-CM | POA: Diagnosis not present

## 2024-02-18 DIAGNOSIS — M19071 Primary osteoarthritis, right ankle and foot: Secondary | ICD-10-CM

## 2024-02-18 DIAGNOSIS — M19041 Primary osteoarthritis, right hand: Secondary | ICD-10-CM | POA: Diagnosis not present

## 2024-02-18 DIAGNOSIS — Z87438 Personal history of other diseases of male genital organs: Secondary | ICD-10-CM

## 2024-02-18 LAB — COMPREHENSIVE METABOLIC PANEL WITH GFR
AG Ratio: 1.4 (calc) (ref 1.0–2.5)
ALT: 18 U/L (ref 9–46)
AST: 14 U/L (ref 10–35)
Albumin: 4.2 g/dL (ref 3.6–5.1)
Alkaline phosphatase (APISO): 68 U/L (ref 35–144)
BUN: 12 mg/dL (ref 7–25)
CO2: 32 mmol/L (ref 20–32)
Calcium: 9.5 mg/dL (ref 8.6–10.3)
Chloride: 103 mmol/L (ref 98–110)
Creat: 1.11 mg/dL (ref 0.70–1.35)
Globulin: 3.1 g/dL (ref 1.9–3.7)
Glucose, Bld: 94 mg/dL (ref 65–99)
Potassium: 3.9 mmol/L (ref 3.5–5.3)
Sodium: 141 mmol/L (ref 135–146)
Total Bilirubin: 0.9 mg/dL (ref 0.2–1.2)
Total Protein: 7.3 g/dL (ref 6.1–8.1)
eGFR: 76 mL/min/1.73m2 (ref >=60)

## 2024-02-18 LAB — CBC WITH DIFFERENTIAL/PLATELET
Absolute Lymphocytes: 1881 {cells}/uL (ref 850–3900)
Absolute Monocytes: 541 {cells}/uL (ref 200–950)
Basophils Absolute: 59 {cells}/uL (ref 0–200)
Basophils Relative: 0.9 %
Eosinophils Absolute: 363 {cells}/uL (ref 15–500)
Eosinophils Relative: 5.5 %
HCT: 45.4 % (ref 39.4–51.1)
Hemoglobin: 16.5 g/dL (ref 13.2–17.1)
MCH: 34.9 pg — ABNORMAL HIGH (ref 27.0–33.0)
MCHC: 36.3 g/dL — ABNORMAL HIGH (ref 31.6–35.4)
MCV: 96 fL (ref 81.4–101.7)
MPV: 10.4 fL (ref 7.5–12.5)
Monocytes Relative: 8.2 %
Neutro Abs: 3755 {cells}/uL (ref 1500–7800)
Neutrophils Relative %: 56.9 %
Platelets: 205 Thousand/uL (ref 140–400)
RBC: 4.73 Million/uL (ref 4.20–5.80)
RDW: 13 % (ref 11.0–15.0)
Total Lymphocyte: 28.5 %
WBC: 6.6 Thousand/uL (ref 3.8–10.8)

## 2024-02-18 NOTE — Patient Instructions (Signed)
 Standing Labs We placed an order today for your standing lab work.   Please have your standing labs drawn in March and every 3 months   Please have your labs drawn 2 weeks prior to your appointment so that the provider can discuss your lab results at your appointment, if possible.  Please note that you may see your imaging and lab results in MyChart before we have reviewed them. We will contact you once all results are reviewed. Please allow our office up to 72 hours to thoroughly review all of the results before contacting the office for clarification of your results.  WALK-IN LAB HOURS  Monday through Thursday from 8:00 am - 4:30 pm and Friday from 8:00 am-12:00 pm.  Patients with office visits requiring labs will be seen before walk-in labs.  You may encounter longer than normal wait times. Please allow additional time. Wait times may be shorter on  Monday and Thursday afternoons.  We do not book appointments for walk-in labs. We appreciate your patience and understanding with our staff.   Labs are drawn by [facility]. Please bring your co-pay at the time of your lab draw.  You may receive a bill from [facility] for your lab work.  Please note if you are on Hydroxychloroquine and and an order has been placed for a Hydroxychloroquine level,  you will need to have it drawn 4 hours or more after your last dose.  If you wish to have your labs drawn at another location, please call the office 24 hours in advance so we can fax the orders.  The office is located at [address], [place], [place] [address]   If you have any questions regarding directions or hours of operation,  please call [phone].   As a reminder, please drink plenty of water  prior to coming for your lab work. Thanks!

## 2024-02-18 NOTE — Progress Notes (Signed)
 Please make sure the patient is taking folic acid  as prescribed-MCH and MCHC are borderline elevated. We will continue to monitor.  Rest of CBC WNL.

## 2024-02-19 NOTE — Progress Notes (Signed)
 CMP WNL

## 2024-03-04 ENCOUNTER — Other Ambulatory Visit: Payer: Self-pay | Admitting: Physician Assistant

## 2024-03-04 DIAGNOSIS — Z79899 Other long term (current) drug therapy: Secondary | ICD-10-CM

## 2024-03-04 DIAGNOSIS — M0579 Rheumatoid arthritis with rheumatoid factor of multiple sites without organ or systems involvement: Secondary | ICD-10-CM

## 2024-03-04 NOTE — Telephone Encounter (Signed)
 Last Fill: 12/10/2023  Labs: 02/18/2024 MCH and MCHC are borderline elevated. Rest of CBC WNL.   Next Visit: 07/21/2024  Last Visit: 02/18/2024  DX: Rheumatoid arthritis involving multiple sites with positive rheumatoid factor   Current Dose per office note 02/18/2024: methotrexate  6 tablets by mouth once weekly   Okay to refill Methotrexate ?

## 2024-05-26 ENCOUNTER — Encounter: Admitting: Nurse Practitioner

## 2024-05-27 ENCOUNTER — Encounter: Admitting: Nurse Practitioner

## 2024-07-21 ENCOUNTER — Ambulatory Visit: Admitting: Physician Assistant
